# Patient Record
Sex: Female | Born: 1970 | Race: Black or African American | Hispanic: No | Marital: Single | Smoking: Current every day smoker
Health system: Southern US, Community
[De-identification: ages and names within clinical notes are randomized; demographics above are authoritative.]

## PROBLEM LIST (undated history)

## (undated) DIAGNOSIS — E785 Hyperlipidemia, unspecified: Secondary | ICD-10-CM

## (undated) DIAGNOSIS — I10 Essential (primary) hypertension: Secondary | ICD-10-CM

## (undated) DIAGNOSIS — D649 Anemia, unspecified: Secondary | ICD-10-CM

## (undated) DIAGNOSIS — M1990 Unspecified osteoarthritis, unspecified site: Secondary | ICD-10-CM

## (undated) HISTORY — DX: Unspecified osteoarthritis, unspecified site: M19.90

## (undated) HISTORY — DX: Anemia, unspecified: D64.9

## (undated) HISTORY — DX: Essential (primary) hypertension: I10

## (undated) HISTORY — DX: Hyperlipidemia, unspecified: E78.5

---

## 1998-05-01 ENCOUNTER — Encounter: Admission: RE | Admit: 1998-05-01 | Discharge: 1998-05-01 | Payer: Self-pay | Admitting: Sports Medicine

## 1998-05-01 ENCOUNTER — Other Ambulatory Visit: Admission: RE | Admit: 1998-05-01 | Discharge: 1998-05-01 | Payer: Self-pay

## 1998-12-02 ENCOUNTER — Emergency Department (HOSPITAL_COMMUNITY): Admission: EM | Admit: 1998-12-02 | Discharge: 1998-12-02 | Payer: Self-pay | Admitting: Emergency Medicine

## 2000-09-30 ENCOUNTER — Encounter: Admission: RE | Admit: 2000-09-30 | Discharge: 2000-09-30 | Payer: Self-pay | Admitting: Family Medicine

## 2001-07-24 ENCOUNTER — Emergency Department (HOSPITAL_COMMUNITY): Admission: EM | Admit: 2001-07-24 | Discharge: 2001-07-24 | Payer: Self-pay | Admitting: Emergency Medicine

## 2003-02-27 ENCOUNTER — Encounter (INDEPENDENT_AMBULATORY_CARE_PROVIDER_SITE_OTHER): Payer: Self-pay | Admitting: *Deleted

## 2003-02-27 LAB — CONVERTED CEMR LAB

## 2003-03-06 ENCOUNTER — Encounter: Admission: RE | Admit: 2003-03-06 | Discharge: 2003-03-06 | Payer: Self-pay | Admitting: Family Medicine

## 2003-03-06 ENCOUNTER — Encounter (INDEPENDENT_AMBULATORY_CARE_PROVIDER_SITE_OTHER): Payer: Self-pay | Admitting: Specialist

## 2003-03-27 ENCOUNTER — Encounter: Admission: RE | Admit: 2003-03-27 | Discharge: 2003-03-27 | Payer: Self-pay | Admitting: Family Medicine

## 2005-06-09 ENCOUNTER — Ambulatory Visit: Payer: Self-pay | Admitting: Family Medicine

## 2006-07-17 ENCOUNTER — Ambulatory Visit: Payer: Self-pay | Admitting: Family Medicine

## 2006-07-23 DIAGNOSIS — F17200 Nicotine dependence, unspecified, uncomplicated: Secondary | ICD-10-CM

## 2006-07-23 DIAGNOSIS — F411 Generalized anxiety disorder: Secondary | ICD-10-CM | POA: Insufficient documentation

## 2006-07-23 HISTORY — DX: Nicotine dependence, unspecified, uncomplicated: F17.200

## 2006-07-24 ENCOUNTER — Encounter (INDEPENDENT_AMBULATORY_CARE_PROVIDER_SITE_OTHER): Payer: Self-pay | Admitting: *Deleted

## 2007-02-24 ENCOUNTER — Emergency Department (HOSPITAL_COMMUNITY): Admission: EM | Admit: 2007-02-24 | Discharge: 2007-02-24 | Payer: Self-pay | Admitting: Emergency Medicine

## 2007-03-29 ENCOUNTER — Ambulatory Visit: Payer: Self-pay | Admitting: Family Medicine

## 2007-03-31 ENCOUNTER — Ambulatory Visit: Payer: Self-pay | Admitting: Family Medicine

## 2007-04-30 ENCOUNTER — Encounter (INDEPENDENT_AMBULATORY_CARE_PROVIDER_SITE_OTHER): Payer: Self-pay | Admitting: *Deleted

## 2009-02-13 ENCOUNTER — Ambulatory Visit: Payer: Self-pay | Admitting: Family Medicine

## 2009-02-13 ENCOUNTER — Encounter: Payer: Self-pay | Admitting: Family Medicine

## 2009-02-13 DIAGNOSIS — E669 Obesity, unspecified: Secondary | ICD-10-CM | POA: Insufficient documentation

## 2009-02-13 LAB — CONVERTED CEMR LAB: GC Probe Amp, Genital: NEGATIVE

## 2009-02-15 ENCOUNTER — Encounter: Payer: Self-pay | Admitting: Family Medicine

## 2009-07-11 ENCOUNTER — Emergency Department (HOSPITAL_COMMUNITY): Admission: EM | Admit: 2009-07-11 | Discharge: 2009-07-11 | Payer: Self-pay | Admitting: Emergency Medicine

## 2009-11-20 ENCOUNTER — Ambulatory Visit: Payer: Self-pay | Admitting: Family Medicine

## 2009-11-22 ENCOUNTER — Ambulatory Visit: Payer: Self-pay | Admitting: Family Medicine

## 2010-02-25 ENCOUNTER — Ambulatory Visit: Payer: Self-pay | Admitting: Family Medicine

## 2010-02-25 ENCOUNTER — Encounter: Payer: Self-pay | Admitting: Family Medicine

## 2010-02-25 DIAGNOSIS — S025XXA Fracture of tooth (traumatic), initial encounter for closed fracture: Secondary | ICD-10-CM | POA: Insufficient documentation

## 2010-02-25 DIAGNOSIS — N760 Acute vaginitis: Secondary | ICD-10-CM | POA: Insufficient documentation

## 2010-02-25 LAB — CONVERTED CEMR LAB: Whiff Test: POSITIVE

## 2010-02-26 LAB — CONVERTED CEMR LAB
Chlamydia, DNA Probe: NEGATIVE
GC Probe Amp, Genital: NEGATIVE

## 2010-02-27 ENCOUNTER — Encounter: Payer: Self-pay | Admitting: Family Medicine

## 2010-02-28 ENCOUNTER — Encounter: Payer: Self-pay | Admitting: Family Medicine

## 2010-02-28 ENCOUNTER — Ambulatory Visit: Payer: Self-pay | Admitting: Family Medicine

## 2010-02-28 DIAGNOSIS — D4959 Neoplasm of unspecified behavior of other genitourinary organ: Secondary | ICD-10-CM

## 2010-03-05 ENCOUNTER — Encounter: Payer: Self-pay | Admitting: Family Medicine

## 2010-03-05 ENCOUNTER — Telehealth (INDEPENDENT_AMBULATORY_CARE_PROVIDER_SITE_OTHER): Payer: Self-pay | Admitting: Family Medicine

## 2010-03-15 ENCOUNTER — Ambulatory Visit: Payer: Self-pay | Admitting: Family Medicine

## 2010-06-25 NOTE — Miscellaneous (Signed)
Summary: Consent: Colposcopy  Consent: Colposcopy   Imported By: Knox Royalty 03/11/2010 13:58:39  _____________________________________________________________________  External Attachment:    Type:   Image     Comment:   External Document
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
## 2010-06-25 NOTE — Letter (Signed)
Summary: COLPO Letter  [facility]  [address]   [place], [place] [address]   Phone: [phone]  Fax: [phone]    03/05/2010  [facility] [address] [place], [place]  [address]  Dear Ms. Grennan, The biopsy from your cervix was normal. No abnormal cells noted. This is good news and we do not need to do anythig else.          Sincerely,   Denny Levy MD  Appended Document: COLPO Letter mailed

## 2010-06-25 NOTE — Progress Notes (Signed)
  Phone Note Outgoing Call   Summary of Call: DEAR WHITE TEAM please let her know the cervix biopsy was NORMAL> I am sending her a letter but she was really nervous about this.  Thanks!  Denny Levy MD  March 05, 2010 10:43 AM   Follow-up for Phone Call        LVM for patient to call back office Follow-up by: Jimmy Footman, CMA,  March 05, 2010 11:46 AM  Additional Follow-up for Phone Call Additional follow up Details #1::        pt called back and was read the letter. she was pleased Additional Follow-up by: De Nurse,  March 05, 2010 1:34 PM

## 2010-06-25 NOTE — Letter (Signed)
Summary: Lab-Female  All     ,     Phone:   Fax:     02/27/2010        [facility] [address]  [place], [place]  [address]   Dear Ms. Sferrazza:  We have carefully reviewed the results of your tests noted below and the results are:     PAP smear: NEGATIVE FOR INTRAEPITHELIAL LESIONS OR MALIGNANCY. on 02/25/2010 -- Normal  Your STD screen for Gonorrhea and Chlamydia was negative.       THE ABOVE RESULTS ARE: WITHIN NORMAL LIMITS.    If you have any questions, please call. We appreciate being able to work with you.   Sincerely,   Milinda Antis MD Typed by: Milinda Antis MD  Appended Document: Lab-Female mailed.

## 2010-06-25 NOTE — Assessment & Plan Note (Signed)
Summary: tb test,tcb  Nurse Visit   Allergies: No Known Drug Allergies  Immunizations Administered:  PPD Skin Test:    Vaccine Type: PPD    Site: left forearm    Mfr: Sanofi Pasteur    Dose: 0.1 ml    Route: ID    Given by: Theresia Lo RN    Exp. Date: 03/08/2011    Lot #: [id number]  Orders Added: 1)  TB Skin Test [86580] 2)  Admin 1st Vaccine [phone]

## 2010-06-25 NOTE — Assessment & Plan Note (Signed)
Summary: colpo per [place]/eo   Vital Signs:  Patient profile:   40 year old female Height:      62.5 inches Weight:      208 pounds BMI:     37.57 Temp:     98.5 degrees F Pulse rate:   82 / minute BP sitting:   121 / 85  Vitals Entered By: Golden Circle RN (February 28, 2010 8:34 AM)  Primary Care Provider:  Milinda Antis MD   History of Present Illness: Pt with normal pap but lesion seen grossly on exam here for further eval  Habits & Providers  Alcohol-Tobacco-Diet     Alcohol drinks/day: 0     Tobacco Status: current     Tobacco Counseling: to quit use of tobacco products     Cigarette Packs/Day: 0.5  Exercise-Depression-Behavior     Drug Use: never     Seat Belt Use: always  Current Medications (verified): 1)  Penicillin V Potassium 500 Mg Tabs (Penicillin V Potassium) .Marland Kitchen.. 1 By Mouth Two Times A Day X 10 Days For Tooth Infection 2)  Flagyl 500 Mg Tabs (Metronidazole) .Marland Kitchen.. 1 By Mouth Two Times A Day X 7 Days, For Infection, Do Not Drink Alcohol  Allergies: No Known Drug Allergies  Social History: Drug Use:  never Seat Belt Use:  always Packs/Day:  0.5  Physical Exam  General:  overweight-appearing.   Genitalia:  normal introitus, no external lesions, no vaginal discharge, and mucosa pink and moist.  Cervix reveals small dark lesion at 11:00, flat. remainder of cervix is normal Additional Exam:  Patient given informed consent, signed copy in the chart. Placed in lithotomy position. Cervix viewed with speculum and colposcope. Was the entire squamocolumnar junction seen?no Any acetowhite lesions noted?no Any abnormalities seen with green filter?no Any abnormalities seen with application of Lugol's solution?N/A Was the endocervical canal sampled?N/A Were any cervical biopsies taken?yes 11:00 Were there any complications?no COMMENTS: Patient was given post procedure instructions. We will notify her of any results.    Impression &  Recommendations:  Problem # 1:  LESION, CERVIX (ICD-236.3)  colposcopy with biopsy today  [phone] ok to leave mssg  Orders: Colposcopy w/ biopsy [facility] ([address])  Complete Medication List: 1)  Penicillin V Potassium 500 Mg Tabs (Penicillin v potassium) .Marland Kitchen.. 1 by mouth two times a day x 10 days for tooth infection 2)  Flagyl 500 Mg Tabs (Metronidazole) .Marland Kitchen.. 1 by mouth two times a day x 7 days, for infection, do not drink alcohol  Prevention & Chronic Care Immunizations   Influenza vaccine: Not documented    Tetanus booster: 02/27/2003: Done.    Pneumococcal vaccine: Not documented  Other Screening   Pap smear: NEGATIVE FOR INTRAEPITHELIAL LESIONS OR MALIGNANCY.  (02/25/2010)   Smoking status: current  (02/28/2010)  Lipids   Total Cholesterol: Not documented   LDL: Not documented   LDL Direct: Not documented   HDL: Not documented   Triglycerides: Not documented

## 2010-06-25 NOTE — Assessment & Plan Note (Signed)
Summary: discuss diet/eo   Vital Signs:  Patient profile:   40 year old female Height:      62.5 inches Weight:      206.7 pounds BMI:     37.34 Pulse rate:   84 / minute BP sitting:   113 / 84  (right arm)  Vitals Entered By: Arlyss Repress CMA, (March 15, 2010 9:00 AM) CC: discuss diet Is Patient Diabetic? No Pain Assessment Patient in pain? no        Primary Care Provider:  Milinda Antis MD  CC:  discuss diet.  History of Present Illness: Goal is to lose 26lbs by April 2012   Feels finding a job will help her keep a schedule and not sit around watching TV eating, eating late dinners such 10pm  Does not  eat breakfast, eats lunch between 12-2pm and dinner usually late 7-10, then eats snacks later , use to eat candy during the middle of the night  24 hours Recall- Fried egg sandwhich with red sausage, Donzetta Sprung and Seffner, Cobbler and Maccooni, cabbage, baked chicken, Drink- 1 large sweet tea and Water, Water,  1 soda- [place] Dew , Ate miniture candies- chocolate, No chips/ no cookies  , no fruits or veggies,   Boyfriend is not overweight he does the cooking, he is aware that she is trying to loose weight  Boyfriend cooks a lot of rich foods and desserts and heavy foods, feels some temptation but has some control, Has canned veggies but does not cook very much herself Feels if she can stop drinking so much tea and eat less this will help  Exercise: Feels neighborhood not safe and its cold outside, thinking about [employer] , unsure if she can afford a membership  Habits & Providers  Alcohol-Tobacco-Diet     Tobacco Status: current     Tobacco Counseling: to quit use of tobacco products  Current Medications (verified): 1)  None  Allergies (verified): No Known Drug Allergies  Physical Exam  General:  overweight-appearing.  NAD Vital signs noted    Impression & Recommendations:  Problem # 1:  OBESITY, MODERATE (ICD-278.00) Assessment Unchanged  See  instructions 20 minutes spent on discussing weight loss approaches and exercise. Reiterated no quick fix and no meds exist that are safe for long term weight loss Pt to start with smaller meals- starting with portions and picked beverage as her other vice to cut down on Next visit will discuss types of foods, 3 day recall, shopping list. I do think a better daily routine as most of the day she is "vegging" out as she is young and unemployed. Naiya appears very motivated  Orders: [facility]- Est Level  3 ([address])  Problem # 2:  TOBACCO DEPENDENCE (ICD-305.1) Assessment: Unchanged  Orders: [facility]- Est Level  3 ([address])  Patient Instructions: 1)  Goal is less food/ smaller portions  and some exercise, drink more water, less tea 2)  1. Drink water with meals, Use the Small Plate  3)  2.Goal 1 sweet tea or 1 mountain dew  4)  Look into how much it cost at the [facility]  5)  Next visit-Schedule with Dr.[place]ord [place]/Dr. Gerilyn Pilgrim the nutritionist- Either Nov 10 or 17th in the evening 6)  Bring to that visit a 3 day recall of everything you ate    Orders Added: 1)  [facility]- Est Level  3 [86578]

## 2010-06-25 NOTE — Assessment & Plan Note (Signed)
Summary: READ PPD/KH  Nurse Visit   Allergies: No Known Drug Allergies  PPD Results    Date of reading: 11/22/2009    Results: 0 mm    Interpretation: negative  Orders Added: 1)  No Charge Patient Arrived (NCPA0) [NCPA0] 

## 2011-03-06 LAB — RAPID STREP SCREEN (MED CTR MEBANE ONLY): Streptococcus, Group A Screen (Direct): POSITIVE — AB

## 2011-03-27 ENCOUNTER — Ambulatory Visit: Payer: Self-pay

## 2011-03-28 ENCOUNTER — Ambulatory Visit (INDEPENDENT_AMBULATORY_CARE_PROVIDER_SITE_OTHER): Payer: BC Managed Care – PPO | Admitting: Family Medicine

## 2011-03-28 ENCOUNTER — Encounter: Payer: Self-pay | Admitting: Family Medicine

## 2011-03-28 VITALS — BP 120/83 | HR 76 | Temp 97.9°F | Ht 62.5 in | Wt 186.3 lb

## 2011-03-28 DIAGNOSIS — A499 Bacterial infection, unspecified: Secondary | ICD-10-CM

## 2011-03-28 DIAGNOSIS — N760 Acute vaginitis: Secondary | ICD-10-CM

## 2011-03-28 DIAGNOSIS — B9689 Other specified bacterial agents as the cause of diseases classified elsewhere: Secondary | ICD-10-CM

## 2011-03-28 LAB — POCT WET PREP (WET MOUNT): Trichomonas Wet Prep HPF POC: NEGATIVE

## 2011-03-28 MED ORDER — METRONIDAZOLE 500 MG PO TABS: 500.0000 mg | ORAL_TABLET | Freq: Two times a day (BID) | ORAL | Status: AC

## 2011-03-29 DIAGNOSIS — B9689 Other specified bacterial agents as the cause of diseases classified elsewhere: Secondary | ICD-10-CM | POA: Insufficient documentation

## 2011-03-29 NOTE — Patient Instructions (Signed)
Bacterial Vaginosis Bacterial vaginosis (BV) is a vaginal infection where the normal balance of bacteria in the vagina is disrupted. The normal balance is then replaced by an overgrowth of certain bacteria. There are several different kinds of bacteria that can cause BV. BV is the most common vaginal infection in women of childbearing age. CAUSES   The cause of BV is not fully understood. BV develops when there is an increase or imbalance of harmful bacteria.   Some activities or behaviors can upset the normal balance of bacteria in the vagina and put women at increased risk including:   Having a new sex partner or multiple sex partners.   Douching.   Using an intrauterine device (IUD) for contraception.   It is not clear what role sexual activity plays in the development of BV. However, women that have never had sexual intercourse are rarely infected with BV.  Women do not get BV from toilet seats, bedding, swimming pools or from touching objects around them.  SYMPTOMS   Grey vaginal discharge.   A fish-like odor with discharge, especially after sexual intercourse.   Itching or burning of the vagina and vulva.   Burning or pain with urination.   Some women have no signs or symptoms at all.  DIAGNOSIS  Your caregiver must examine the vagina for signs of BV. Your caregiver will perform lab tests and look at the sample of vaginal fluid through a microscope. They will look for bacteria and abnormal cells (clue cells), a pH test higher than 4.5, and a positive amine test all associated with BV.  RISKS AND COMPLICATIONS   Pelvic inflammatory disease (PID).   Infections following gynecology surgery.   Developing HIV.   Developing herpes virus.  TREATMENT  Sometimes BV will clear up without treatment. However, all women with symptoms of BV should be treated to avoid complications, especially if gynecology surgery is planned. Female partners generally do not need to be treated. However,  BV may spread between female sex partners so treatment is helpful in preventing a recurrence of BV.   BV may be treated with antibiotics. The antibiotics come in either pill or vaginal cream forms. Either can be used with nonpregnant or pregnant women, but the recommended dosages differ. These antibiotics are not harmful to the baby.   BV can recur after treatment. If this happens, a second round of antibiotics will often be prescribed.   Treatment is important for pregnant women. If not treated, BV can cause a premature delivery, especially for a pregnant woman who had a premature birth in the past. All pregnant women who have symptoms of BV should be checked and treated.   For chronic reoccurrence of BV, treatment with a type of prescribed gel vaginally twice a week is helpful.  HOME CARE INSTRUCTIONS   Finish all medication as directed by your caregiver.   Do not have sex until treatment is completed.   Tell your sexual partner that you have a vaginal infection. They should see their caregiver and be treated if they have problems, such as a mild rash or itching.   Practice safe sex. Use condoms. Only have 1 sex partner.  PREVENTION  Basic prevention steps can help reduce the risk of upsetting the natural balance of bacteria in the vagina and developing BV:  Do not have sexual intercourse (be abstinent).   Do not douche.   Use all of the medicine prescribed for treatment of BV, even if the signs and symptoms go away.     Tell your sex partner if you have BV. That way, they can be treated, if needed, to prevent reoccurrence.  SEEK MEDICAL CARE IF:   Your symptoms are not improving after 3 days of treatment.   You have increased discharge, pain, or fever.  MAKE SURE YOU:   Understand these instructions.   Will watch your condition.   Will get help right away if you are not doing well or get worse.  FOR MORE INFORMATION  Division of STD Prevention (DSTDP), Centers for Disease  Control and Prevention: www.cdc.gov/std American Social Health Association (ASHA): www.ashastd.org  Document Released: 05/12/2005 Document Revised: 01/22/2011 Document Reviewed: 11/02/2008 ExitCare Patient Information 2012 [employer]. 

## 2011-03-29 NOTE — Progress Notes (Signed)
  Subjective:    Patient ID: Angelica Campbell, female    DOB: January 08, 1971, 40 y.o.   MRN: [id number]  Vaginal Discharge The patient's primary symptoms include a vaginal discharge.   Vaginal discharge x 1 month. Pt states that discharge has been foul smelling. Pt states that she has hx/o recurrent BV infections. Most recent episode was 6-8 months ago per pt. No fevers, abd pain, vomiting. Discharge hs been foul smelling and is consistent with prior episodes of BV. Pt states that she is not sexually active. Hx/o STDs almost 20 years ago per pt.    Review of Systems  Genitourinary: Positive for vaginal discharge.   See HPI     Objective:   Physical Exam Gen: in bed, NAD ABD: S/NT/+ bowel sounds  GU: normal external genitalia, + vaginal discharge, no CMT   Assessment & Plan:

## 2011-03-29 NOTE — Assessment & Plan Note (Signed)
Clue cells on wet prep. GC/Chl also obtained. Flagyl x 7 days.

## 2011-03-30 ENCOUNTER — Encounter: Payer: Self-pay | Admitting: Family Medicine

## 2011-04-04 ENCOUNTER — Encounter: Payer: Self-pay | Admitting: Family Medicine
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
## 2012-12-09 ENCOUNTER — Ambulatory Visit (INDEPENDENT_AMBULATORY_CARE_PROVIDER_SITE_OTHER): Payer: BC Managed Care – PPO | Admitting: Family Medicine

## 2012-12-09 ENCOUNTER — Encounter: Payer: Self-pay | Admitting: Family Medicine

## 2012-12-09 ENCOUNTER — Other Ambulatory Visit (HOSPITAL_COMMUNITY)
Admission: RE | Admit: 2012-12-09 | Discharge: 2012-12-09 | Disposition: A | Discharge status: Home / Self Care | Payer: BC Managed Care – PPO | Source: Ambulatory Visit | Attending: Family Medicine | Admitting: Family Medicine

## 2012-12-09 VITALS — BP 90/52 | HR 89 | Ht 62.0 in | Wt 179.0 lb

## 2012-12-09 DIAGNOSIS — Z113 Encounter for screening for infections with a predominantly sexual mode of transmission: Secondary | ICD-10-CM | POA: Insufficient documentation

## 2012-12-09 DIAGNOSIS — N760 Acute vaginitis: Secondary | ICD-10-CM

## 2012-12-09 DIAGNOSIS — N898 Other specified noninflammatory disorders of vagina: Secondary | ICD-10-CM

## 2012-12-09 DIAGNOSIS — Z01419 Encounter for gynecological examination (general) (routine) without abnormal findings: Secondary | ICD-10-CM | POA: Insufficient documentation

## 2012-12-09 DIAGNOSIS — Z124 Encounter for screening for malignant neoplasm of cervix: Secondary | ICD-10-CM

## 2012-12-09 LAB — POCT WET PREP (WET MOUNT)
Clue Cells Wet Prep Whiff POC: POSITIVE
WBC, Wet Prep HPF POC: >20

## 2012-12-09 MED ORDER — METRONIDAZOLE 500 MG PO TABS: 500.0000 mg | ORAL_TABLET | Freq: Two times a day (BID) | ORAL | Status: DC

## 2012-12-09 NOTE — Patient Instructions (Addendum)
Thanks for coming in today. It was good to meet you!  Today we discussed your vaginal discharge and performed a pap smear.  From our exam, it looks like you do have Bacterial Vaginosis (BV). As we discussed, this could be caused by a number of different things, including the douching.  We started a new medication today to treat this BV. Flagyl 500 mg twice a day for 7 days total. Read the package information and ask your pharmacist if you have any questions. Caution do NOT drink alcohol while taking this medication, it will cause you to get very sick and vomit.  We will contact you with the results of your other testing and pap smear results if there is anything abnormal.  Please schedule a follow-up appointment for a regular physical in 6 months to 1 year, or sooner as needed.  If you have any other questions or concerns, please feel free to call the clinic to contact me. You may also schedule another appointment if necessary.  However, if your symptoms get significantly worse, please go to the Emergency Department to seek immediate medical attention.  Saralyn Pilar, DO [facility]   Bacterial Vaginosis Bacterial vaginosis (BV) is a vaginal infection where the normal balance of bacteria in the vagina is disrupted. The normal balance is then replaced by an overgrowth of certain bacteria. There are several different kinds of bacteria that can cause BV. BV is the most common vaginal infection in women of childbearing age. CAUSES   The cause of BV is not fully understood. BV develops when there is an increase or imbalance of harmful bacteria.  Some activities or behaviors can upset the normal balance of bacteria in the vagina and put women at increased risk including:  Having a new sex partner or multiple sex partners.  Douching.  Using an intrauterine device (IUD) for contraception.  It is not clear what role sexual activity plays in the development of BV.  However, women that have never had sexual intercourse are rarely infected with BV. Women do not get BV from toilet seats, bedding, swimming pools or from touching objects around them.  SYMPTOMS   Grey vaginal discharge.  A fish-like odor with discharge, especially after sexual intercourse.  Itching or burning of the vagina and vulva.  Burning or pain with urination.  Some women have no signs or symptoms at all. DIAGNOSIS  Your caregiver must examine the vagina for signs of BV. Your caregiver will perform lab tests and look at the sample of vaginal fluid through a microscope. They will look for bacteria and abnormal cells (clue cells), a pH test higher than 4.5, and a positive amine test all associated with BV.  RISKS AND COMPLICATIONS   Pelvic inflammatory disease (PID).  Infections following gynecology surgery.  Developing HIV.  Developing herpes virus. TREATMENT  Sometimes BV will clear up without treatment. However, all women with symptoms of BV should be treated to avoid complications, especially if gynecology surgery is planned. Female partners generally do not need to be treated. However, BV may spread between female sex partners so treatment is helpful in preventing a recurrence of BV.   BV may be treated with antibiotics. The antibiotics come in either pill or vaginal cream forms. Either can be used with nonpregnant or pregnant women, but the recommended dosages differ. These antibiotics are not harmful to the baby.  BV can recur after treatment. If this happens, a second round of antibiotics will often be prescribed.  Treatment is important for pregnant women. If not treated, BV can cause a premature delivery, especially for a pregnant woman who had a premature birth in the past. All pregnant women who have symptoms of BV should be checked and treated.  For chronic reoccurrence of BV, treatment with a type of prescribed gel vaginally twice a week is helpful. HOME CARE  INSTRUCTIONS   Finish all medication as directed by your caregiver.  Do not have sex until treatment is completed.  Tell your sexual partner that you have a vaginal infection. They should see their caregiver and be treated if they have problems, such as a mild rash or itching.  Practice safe sex. Use condoms. Only have 1 sex partner. PREVENTION  Basic prevention steps can help reduce the risk of upsetting the natural balance of bacteria in the vagina and developing BV:  Do not have sexual intercourse (be abstinent).  Do not douche.  Use all of the medicine prescribed for treatment of BV, even if the signs and symptoms go away.  Tell your sex partner if you have BV. That way, they can be treated, if needed, to prevent reoccurrence. SEEK MEDICAL CARE IF:   Your symptoms are not improving after 3 days of treatment.  You have increased discharge, pain, or fever. MAKE SURE YOU:   Understand these instructions.  Will watch your condition.  Will get help right away if you are not doing well or get worse. FOR MORE INFORMATION  Division of STD Prevention (DSTDP), Centers for Disease Control and Prevention: [employer] American Social Health Association (ASHA): www.ashastd.org  Document Released: 05/12/2005 Document Revised: 08/04/2011 Document Reviewed: 11/02/2008 [facility] Patient Information 2014 [place], [place].

## 2012-12-09 NOTE — Assessment & Plan Note (Addendum)
Similar complaint to previous episode of BV. Increased white discharge, with occasional odor and some itching. No vaginal bleeding, abd pain, or constitutional sx. Reports sexual hx of 1 partner for 15 years, no condom use, remote hx Chlamydia (>58yrs). Admits to douching frequently, was told before it could cause bacterial growth.  Performed Pelvic Exam for pap smear, wet prep, and GC/Chlamydia testing. Mild thin white discharge, with minimal odor on exam, no signs of inflammation, no lesions, and no cervical motor tenderness. Normal bi-manual exam.  Pap smear performed. GC/Chlam swab collected. Pending results in about 1 week. Wet Prep - +KOH Whiff test, +many Clue cells, >20 WBCs, negative for yeast, trich  For BV, will treat with Flagyl 500mg  BID for 7 days. Advised on side-effects, cautioned NO EtOH. Provided education on preventing BV, patient agrees, will likely quit douching to clean. Will call with results of pap and GC/Chlam. Follow-up PRN.

## 2012-12-09 NOTE — Progress Notes (Signed)
Subjective:     Patient ID: Angelica Campbell, female   DOB: 10-22-1970, 42 y.o.   MRN: [id number]  HPI  VAGINAL DISCHARGE:  42 y.o. female complains of white and thin vaginal discharge with mild odor for 1 week. Admits to some vaginal itching. Reports that this is similar to prior episode of BV about 2 years ago. She does douche for cleansing, and was told this might cause bacteria to grow, but has continued to do so.  Denies abnormal vaginal bleeding, pelvic or abdominal pain, fever. No UTI symptoms or burning on urination. Denies recent history of known exposure to STD.  Sexual Hx: 1 sexual partner in past 15 years, no condom use, remote hx of Chlamydia (>55yrs), but denies any increased risk of STI infection.   Review of Systems  See above HPI.     Objective:   Physical Exam  BP 90/52  Pulse 89  Ht 5\' 2"  (1.575 m)  Wt 179 lb (81.194 kg)  BMI 32.73 kg/m2  General - pleasant, WDWN, NAD Neck - supple, non-tender, no LAD Abd - soft, non-tender, no masses, +BS Pelvic exam: normal external genitalia, vulva, vagina, cervix, uterus and adnexa, VULVA: normal appearing vulva with no masses, tenderness or lesions, VAGINA: normal appearing vagina with normal color and discharge, no lesions, vaginal discharge - white, thin, CERVIX: cervical discharge present - white, UTERUS: uterus is normal size, shape, consistency and nontender, ADNEXA: normal adnexa in size, nontender and no masses  PAP: Pap smear done today. exam chaperoned by Arlyss Repress GC/Chlamydia test collected. WET MOUNT done - results: many clue cells, >20 WBC, +KOH whiff test, no trichomonas, no hyphae or spores seen

## 2012-12-14 ENCOUNTER — Telehealth: Payer: Self-pay | Admitting: *Deleted

## 2012-12-14 NOTE — Telephone Encounter (Signed)
Called pt. LMVM to call back. Please see Dr.Karamalegos message. Thank you. Lorenda Hatchet, Renato Battles

## 2012-12-14 NOTE — Telephone Encounter (Signed)
Message copied by Arlyss Repress on Tue Dec 14, 2012 10:47 AM ------      Message from: Smitty Cords      Created: Tue Dec 14, 2012 10:34 AM      Regarding: Normal Results       Hi,      I have reviewed the recent results for Cytology PAP smear and GC/Chlamydia collected (12/09/12). All results were normal.            This does not affect the results of her Wet Prep on 12/09/12 that showed BV. She is to continue her Flagyl medication for a total of 7 days, as we discussed at her apt. She does not need another apt at this time, unless she is not improving.            She can call to schedule a follow-up apt in 6 months to 1 yr.            If you could please call her to relay this message, I would greatly appreciate it!            Thanks!      -Trinna Post K ------

## 2013-07-22 ENCOUNTER — Encounter: Payer: Self-pay | Admitting: Family Medicine

## 2013-07-22 ENCOUNTER — Ambulatory Visit (INDEPENDENT_AMBULATORY_CARE_PROVIDER_SITE_OTHER): Payer: BC Managed Care – PPO | Admitting: Family Medicine

## 2013-07-22 VITALS — BP 127/88 | HR 90 | Temp 99.5°F | Ht 62.5 in | Wt 187.0 lb

## 2013-07-22 DIAGNOSIS — H9203 Otalgia, bilateral: Secondary | ICD-10-CM

## 2013-07-22 DIAGNOSIS — J029 Acute pharyngitis, unspecified: Secondary | ICD-10-CM

## 2013-07-22 DIAGNOSIS — H9209 Otalgia, unspecified ear: Secondary | ICD-10-CM

## 2013-07-22 LAB — POCT RAPID STREP A (OFFICE): RAPID STREP A SCREEN: NEGATIVE

## 2013-07-22 MED ORDER — IPRATROPIUM BROMIDE 0.06 % NA SOLN: 2.0000 | Freq: Four times a day (QID) | NASAL | Status: DC

## 2013-07-22 MED ORDER — BENZOCAINE-MENTHOL 15-4 MG MT LOZG: 1.0000 | LOZENGE | OROMUCOSAL | Status: DC | PRN

## 2013-07-22 MED ORDER — CETIRIZINE HCL 10 MG PO TABS: 10.0000 mg | ORAL_TABLET | Freq: Every day | ORAL | Status: DC

## 2013-07-22 MED ORDER — PREDNISONE 20 MG PO TABS: 60.0000 mg | ORAL_TABLET | Freq: Every day | ORAL | Status: DC

## 2013-07-22 MED ORDER — IBUPROFEN 600 MG PO TABS: 600.0000 mg | ORAL_TABLET | Freq: Four times a day (QID) | ORAL | Status: DC | PRN

## 2013-07-22 NOTE — Assessment & Plan Note (Signed)
No evidence of otitis media on exam. Likely combination of sinus congestion and viral URI contributing to ear discomfort. Ipratropium spray and zyrtec for nasal congestion

## 2013-07-22 NOTE — Progress Notes (Signed)
Patient ID: ORENA CAVAZOS    DOB: 29-Sep-1970, 43 y.o.   MRN: [id number] --- Subjective:  Arabela is a 43 y.o.female who presents with sore throat and bilateral ear pain since Wednesday. She started noticing tingling in her ears and now she has a sensation of discomfort bilaterally. She also says that her throat has been sore to the point of not being able to eat. She has not really had any desire to drink fluids either. She denies any fevers. She denies any cough. Denies any congestion or rhinorrhea. No shortness of breath. She has an occasional associated headache. She's been trying NyQuil which has not helped. She has tried ibuprofen which has not helped either.  ROS: see HPI Past Medical History: reviewed and updated medications and allergies. Social History: Tobacco: Current smoker  Objective: Filed Vitals:   07/22/13 1021  BP: 127/88  Pulse: 90  Temp: 99.5 F (37.5 C)    Physical Examination:   General appearance - alert, well appearing, and in no distress Ears - bilateral TM's and external ear canals normal, no effusion, no erythema or bulging membranes, no external pain.  Nose - moderate erythema and congestion of the nasal turbinates bilaterally Mouth - moist mucous membranes, erythematous oropharynx and tonsils, no tonsillar exudates Neck - supple, no tender lymphadenopathy Chest - clear to auscultation, no wheezes, rales or rhonchi, symmetric air entry Heart - normal rate, regular rhythm, normal S1, S2, no murmurs

## 2013-07-22 NOTE — Patient Instructions (Addendum)
The strep test was negative.  For the sore throat, I am going to treat you with 2 days of steroids to help with it.  I am also sending a prescription for high dose ibuprofen to take every 6hrs as needed for pain. Do not take the aleve at the same time.   I am also going to send a nose spray and an allergy pill which I think will also help with the headache and feeling of pressure in your ears.   If you do not get any better over the weekend, if you gets worst or develop fever, please return to care.   Pharyngitis Pharyngitis is redness, pain, and swelling (inflammation) of your pharynx.  CAUSES  Pharyngitis is usually caused by infection. Most of the time, these infections are from viruses (viral) and are part of a cold. However, sometimes pharyngitis is caused by bacteria (bacterial). Pharyngitis can also be caused by allergies. Viral pharyngitis may be spread from person to person by coughing, sneezing, and personal items or utensils (cups, forks, spoons, toothbrushes). Bacterial pharyngitis may be spread from person to person by more intimate contact, such as kissing.  SIGNS AND SYMPTOMS  Symptoms of pharyngitis include:   Sore throat.   Tiredness (fatigue).   Low-grade fever.   Headache.  Joint pain and muscle aches.  Skin rashes.  Swollen lymph nodes.  Plaque-like film on throat or tonsils (often seen with bacterial pharyngitis). DIAGNOSIS  Your health care provider will ask you questions about your illness and your symptoms. Your medical history, along with a physical exam, is often all that is needed to diagnose pharyngitis. Sometimes, a rapid strep test is done. Other lab tests may also be done, depending on the suspected cause.  TREATMENT  Viral pharyngitis will usually get better in 3 4 days without the use of medicine. Bacterial pharyngitis is treated with medicines that kill germs (antibiotics).  HOME CARE INSTRUCTIONS   Drink enough water and fluids to keep your  urine clear or pale yellow.   Only take over-the-counter or prescription medicines as directed by your health care provider:   If you are prescribed antibiotics, make sure you finish them even if you start to feel better.   Do not take aspirin.   Get lots of rest.   Gargle with 8 oz of salt water ( tsp of salt per 1 qt of water) as often as every 1 2 hours to soothe your throat.   Throat lozenges (if you are not at risk for choking) or sprays may be used to soothe your throat. SEEK MEDICAL CARE IF:   You have large, tender lumps in your neck.  You have a rash.  You cough up green, yellow-brown, or bloody spit. SEEK IMMEDIATE MEDICAL CARE IF:   Your neck becomes stiff.  You drool or are unable to swallow liquids.  You vomit or are unable to keep medicines or liquids down.  You have severe pain that does not go away with the use of recommended medicines.  You have trouble breathing (not caused by a stuffy nose). MAKE SURE YOU:   Understand these instructions.  Will watch your condition.  Will get help right away if you are not doing well or get worse. Document Released: 05/12/2005 Document Revised: 03/02/2013 Document Reviewed: 01/17/2013 [facility] Patient Information 2014 [place].

## 2013-07-22 NOTE — Assessment & Plan Note (Signed)
Strep test negative and centor criteria score of 1. No indication to check culture.  Likely viral.  Since she is having difficulty swallowing, will treat with 2 days of prednisone 60mg  daily.  Treat pain with ibuprofen 600mg  q6/prn.  cepacol lozenges

## 2014-01-18 ENCOUNTER — Encounter: Payer: Self-pay | Admitting: Family Medicine

## 2014-01-18 ENCOUNTER — Ambulatory Visit (INDEPENDENT_AMBULATORY_CARE_PROVIDER_SITE_OTHER): Payer: Self-pay | Admitting: Family Medicine

## 2014-01-18 VITALS — BP 116/83 | HR 101 | Temp 98.2°F | Ht 62.5 in | Wt 182.0 lb

## 2014-01-18 DIAGNOSIS — T7840XA Allergy, unspecified, initial encounter: Secondary | ICD-10-CM

## 2014-01-18 NOTE — Patient Instructions (Signed)

## 2014-01-18 NOTE — Assessment & Plan Note (Signed)
Non-severe without any throat swelling or oral edema.  She is not on medications so not a drug reaction.  Most likely bug bite or food (no seafood allergy history or recent seafood ingestion).  At this point, continue with zyrtec daily and benadryl 25 mg TID PRN for itching and topical hydrocortisone to arm if itchy.  F/U PRN.

## 2014-01-18 NOTE — Progress Notes (Signed)
Angelica Campbell is a 43 y.o. female who presents today for facial edema and hand swelling.  This began about three days ago now, after a church functioning meal.  She was not sure what she ate at that time but thinks it was related to food or possible bug bite on her R hand.  She started to have hand edema two days ago and this ended up spreading proximally to her arm but denies any pain, paresthesias, fever, chills, or sweats.  She started to have eyelid swelling yesterday but denies any throat edema, dysphagia, dyspnea, or diplopia.  She has tried zyrtec and started children's benadryl yesterday that has helped somewhat.   No past medical history on file.  History  Smoking status  . Current Every Day Smoker -- 0.50 packs/day  . Types: Cigarettes  Smokeless tobacco  . Not on file    No family history on file.  Current Outpatient Prescriptions on File Prior to Visit  Medication Sig Dispense Refill  . Benzocaine-Menthol 15-4 MG LOZG Use as directed 1 lozenge in the mouth or throat as needed (throat pain).  168 lozenge  0  . cetirizine (ZYRTEC) 10 MG tablet Take 1 tablet (10 mg total) by mouth daily.  30 tablet  11  . ibuprofen (ADVIL,MOTRIN) 600 MG tablet Take 1 tablet (600 mg total) by mouth every 6 (six) hours as needed.  30 tablet  0  . ipratropium (ATROVENT) 0.06 % nasal spray Place 2 sprays into both nostrils 4 (four) times daily.  15 mL  12  . metroNIDAZOLE (FLAGYL) 500 MG tablet Take 1 tablet (500 mg total) by mouth 2 (two) times daily.  21 tablet  0  . predniSONE (DELTASONE) 20 MG tablet Take 3 tablets (60 mg total) by mouth daily with breakfast. Take for 2 days  6 tablet  0   No current facility-administered medications on file prior to visit.    ROS: Per HPI.  All other systems reviewed and are negative.   Physical Exam Filed Vitals:   01/18/14 1346  BP: 116/83  Pulse: 101  Temp: 98.2 F (36.8 C)    Physical Examination: General appearance - alert, well appearing,  and in no distress Mouth - O/P clear, no glossitis  Neck - supple, no significant adenopathy Skin - R hand with minimal edema, no erythema or obvious bite/deformity.  No warmth to touch or TTP

## 2014-01-20 ENCOUNTER — Telehealth: Payer: Self-pay | Admitting: Family Medicine

## 2014-01-20 NOTE — Telephone Encounter (Signed)
Letter printed and placed upfront. Pt aware per jackie. [place], [place] CMA

## 2014-01-20 NOTE — Telephone Encounter (Signed)
Pt called and needs a letter stating that she was at the doctors on 8/26 for a sick visit.  Please call her when it is ready for pick up. jw

## 2014-08-09 ENCOUNTER — Encounter: Payer: Self-pay | Admitting: Family Medicine

## 2014-08-09 ENCOUNTER — Ambulatory Visit (INDEPENDENT_AMBULATORY_CARE_PROVIDER_SITE_OTHER): Payer: PRIVATE HEALTH INSURANCE | Admitting: Family Medicine

## 2014-08-09 VITALS — BP 118/62 | HR 106 | Temp 98.0°F | Wt 184.4 lb

## 2014-08-09 DIAGNOSIS — B9689 Other specified bacterial agents as the cause of diseases classified elsewhere: Secondary | ICD-10-CM

## 2014-08-09 DIAGNOSIS — Z131 Encounter for screening for diabetes mellitus: Secondary | ICD-10-CM

## 2014-08-09 DIAGNOSIS — E669 Obesity, unspecified: Secondary | ICD-10-CM

## 2014-08-09 DIAGNOSIS — Z13220 Encounter for screening for lipoid disorders: Secondary | ICD-10-CM

## 2014-08-09 DIAGNOSIS — A499 Bacterial infection, unspecified: Secondary | ICD-10-CM

## 2014-08-09 DIAGNOSIS — N898 Other specified noninflammatory disorders of vagina: Secondary | ICD-10-CM | POA: Insufficient documentation

## 2014-08-09 DIAGNOSIS — F17200 Nicotine dependence, unspecified, uncomplicated: Secondary | ICD-10-CM

## 2014-08-09 DIAGNOSIS — N760 Acute vaginitis: Secondary | ICD-10-CM

## 2014-08-09 DIAGNOSIS — Z0000 Encounter for general adult medical examination without abnormal findings: Secondary | ICD-10-CM

## 2014-08-09 DIAGNOSIS — Z720 Tobacco use: Secondary | ICD-10-CM

## 2014-08-09 LAB — POCT WET PREP (WET MOUNT): CLUE CELLS WET PREP WHIFF POC: POSITIVE

## 2014-08-09 MED ORDER — METRONIDAZOLE 500 MG PO TABS: 500.0000 mg | ORAL_TABLET | Freq: Two times a day (BID) | ORAL | Status: DC

## 2014-08-09 NOTE — Assessment & Plan Note (Addendum)
Confirmed BV on wet prep (mod clue cells, positive whiff) - Normal history of BV with 1 episode every 2 years. Does not seem to have recurrent or resistant episodes.  Plan: 1. Metronidazole 500mg  PO BID x 7 days - avoid alcohol 2. Advised on counseling to prevent BV - condoms, avoid basic pH semen, try yogurt / probiotics, avoid douching / cleansing 3. RTC PRN

## 2014-08-09 NOTE — Assessment & Plan Note (Signed)
Chronic active smoker, 0.5ppd >20 years - Currently not ready to quit - Previously quit x 1 year when pregnant - No medications or NRT  Plan: 1. Smoking cessation counseling. Advised to mentally prepare and plan for future quit date, taper down on # of cigs daily, seek support 2. Follow-up when ready, can refer to Dr. Valentina Lucks (Pharmacy Clinic - Smoking Cessation) 3. Considering 1 year quit date at next physical

## 2014-08-09 NOTE — Assessment & Plan Note (Signed)
Screening: - Ordered future BMET (family hx DM) and fasting Lipid Panel (will calculate ASCVD risk calculator, +smoker) - Declined routine HIV screen - Handout given and interested in scheduling Mammogram - Due for pap smear and High Risk HPV (co-testing) in 11/2015  Immunizations: - Declined influenza, TDap

## 2014-08-09 NOTE — Progress Notes (Signed)
   Subjective:    Patient ID: Angelica Campbell, female    DOB: [birth year], 44 y.o.   MRN: [id number]  Patient presents for annual exam.  HPI  VAGINAL DISCHARGE: - History of prior BV episodes in past, seems to be about 1 every 2 years - Today reports recent increased vaginal odor and intermittent "creamy" vaginal discharge, similar to prior BV. No recent known triggers. Previously did some douching to cleanse, but has not done this recently. - Sexual history, 1 female partner, same for >10 years, unprotected (no condom use) - No prior history of STD - Denies any fevers/chills, itching, dysuria, rash, abdominal or pelvic pain  TOBACCO ABUSE: - Active smoker, 0.5ppd, chronic smoker >20 years. Previously quit while pregnant - Never tried medications or NRT - Has not tried to quit recently. Feels like she "wants to stop", but is not prepared.  Family hx: - Breast Cancer - Mother (20 yr)  HM: - Last pap smear 11/2012 (negative, no HPV testing), due 11/2015 with HPV co-testing. 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 Normal work of breathing. Abd - soft, NTND, no masses, +active BS Ext - non-tender, no edema, peripheral pulses intact +2 b/l Skin - warm, dry, no rashes Neuro - awake, alert, grossly non-focal Pelvic Exam: Normal external female genitalia. Vaginal canal without lesions. Normal appearing cervix, without lesions or bleeding. Increased thin creamy white discharge on exam. Bimanual exam without masses or cervical motion tenderness.  Exam chaperoned by Delray Alt, CMA.     Assessment & Plan:   See specific A&P problem list for details.

## 2014-08-09 NOTE — Patient Instructions (Addendum)
Thank you for coming into clinic today.  Performed wet prep today - showed BV - take Metronidazole 500mg  twice daily for 7 days. If develop yeast infection after, may call for treatment Recommendations to reduce Bacterial Vaginosis - partner wear condoms or avoid direct contact with semen to disrupt vaginal pH, may eat yogurt or probiotic to restore healthy bacteria, avoid cleansing chemical or procedures This is normal, and may occur 1-2x yearly or every 2 years. Due for Pap Smear in 11/2015 Consider flu shot in future  When you are ready to quit smoking please let me know, and I can help you with nicotine replacement or medications. Can refer you to Dr. Valentina Lucks - smoking cessation specialist pharmacist.  Ordered Future blood work with Cholesterol and [facility] - please call [place] to Schedule a "Lab Only Visit" for blood work - do not eat after midnight on night before you choose to come in for fasting labs, don't eat or drink in the morning. Schedule early appointment for labs only.  Please schedule follow-up in 1 year or as needed for next physical.  [name], [place], PGY-2

## 2015-05-24 ENCOUNTER — Encounter: Payer: Self-pay | Admitting: Family Medicine

## 2015-05-24 ENCOUNTER — Ambulatory Visit (INDEPENDENT_AMBULATORY_CARE_PROVIDER_SITE_OTHER): Payer: Managed Care, Other (non HMO) | Admitting: Family Medicine

## 2015-05-24 VITALS — BP 133/80 | HR 86 | Temp 98.0°F | Wt 194.0 lb

## 2015-05-24 DIAGNOSIS — R300 Dysuria: Secondary | ICD-10-CM

## 2015-05-24 DIAGNOSIS — N218 Other lower urinary tract calculus: Secondary | ICD-10-CM | POA: Diagnosis not present

## 2015-05-24 DIAGNOSIS — N209 Urinary calculus, unspecified: Secondary | ICD-10-CM | POA: Insufficient documentation

## 2015-05-24 LAB — POCT URINALYSIS DIPSTICK
Bilirubin, UA: NEGATIVE
Blood, UA: NEGATIVE
Glucose, UA: NEGATIVE
Ketones, UA: NEGATIVE
Leukocytes, UA: NEGATIVE
Nitrite, UA: NEGATIVE
Protein, UA: NEGATIVE
Spec Grav, UA: 1.025
Urobilinogen, UA: 0.2
pH, UA: 6.5

## 2015-05-24 NOTE — Assessment & Plan Note (Addendum)
Dx by history, recently passed. Doubt musculoskeletal or gastrointestinal etiology. Reviewed prophylactic measures for kidney stones, see AVS, and recommended NSAID and return to clinic prn recurrent symptoms. Urged not to take prescription pain medication not prescribed to her.

## 2015-05-24 NOTE — Progress Notes (Signed)
Subjective: Angelica Campbell is a 44 y.o. female presenting for right flank pain, now improved.  4 days ago noted abrupt onset of right lower back/side pain that was severe, sharp, constant, and radiating into the groin. She was unable to sit still. Worsened by laying on that side, better the next morning after taking tylenol and her boyfriend's pain medication. The pain is now better and more dull. Some dysuria but no fevers or gross blood in urine. Reports nausea without vomiting or abdominal pain.   - No personal history of kidney stones or gout. Uncle had kidney stones - Smokes about 1/2 ppd, no EtOH, no illicit drugs.   Objective: BP 133/80 mmHg  Pulse 86  Temp(Src) 98 F (36.7 C) (Oral)  Wt 194 lb (87.998 kg) Gen: Well-appearing 44 y.o. female in no distress GI: Normoactive BS; soft, non-tender, non-distended, no organomegaly or suprapubic tenderness. Back: No CVA tenderness or tenderness to palpation of spine and paraspinal muscles.   Assessment/Plan: Angelica Campbell is a 44 y.o. female here for probable passed kidney stone.  Urolithiasis Dx by history, recently passed. Doubt musculoskeletal or gastrointestinal etiology. Reviewed prophylactic measures for kidney stones, see AVS, and recommended NSAID and return to clinic prn recurrent symptoms. Urged not to take prescription pain medication not prescribed to her.

## 2015-05-24 NOTE — Patient Instructions (Signed)
Kidney Stones °Kidney stones (urolithiasis) are deposits that form inside your kidneys. The intense pain is caused by the stone moving through the urinary tract. When the stone moves, the ureter goes into spasm around the stone. The stone is usually passed in the urine.  °CAUSES  °· A disorder that makes certain neck glands produce too much parathyroid hormone (primary hyperparathyroidism). °· A buildup of uric acid crystals, similar to gout in your joints. °· Narrowing (stricture) of the ureter. °· A kidney obstruction present at birth (congenital obstruction). °· Previous surgery on the kidney or ureters. °· Numerous kidney infections. °SYMPTOMS  °· Feeling sick to your stomach (nauseous). °· Throwing up (vomiting). °· Blood in the urine (hematuria). °· Pain that usually spreads (radiates) to the groin. °· Frequency or urgency of urination. °DIAGNOSIS  °· Taking a history and physical exam. °· Blood or urine tests. °· CT scan. °· Occasionally, an examination of the inside of the urinary bladder (cystoscopy) is performed. °TREATMENT  °· Observation. °· Increasing your fluid intake. °· Extracorporeal shock wave lithotripsy--This is a noninvasive procedure that uses shock waves to break up kidney stones. °· Surgery may be needed if you have severe pain or persistent obstruction. There are various surgical procedures. Most of the procedures are performed with the use of small instruments. Only small incisions are needed to accommodate these instruments, so recovery time is minimized. °The size, location, and chemical composition are all important variables that will determine the proper choice of action for you. Talk to your health care provider to better understand your situation so that you will minimize the risk of injury to yourself and your kidney.  °HOME CARE INSTRUCTIONS  °· Drink enough water and fluids to keep your urine clear or pale yellow. This will help you to pass the stone or stone fragments. °· Strain  all urine through the provided strainer. Keep all particulate matter and stones for your health care provider to see. The stone causing the pain may be as small as a grain of salt. It is very important to use the strainer each and every time you pass your urine. The collection of your stone will allow your health care provider to analyze it and verify that a stone has actually passed. The stone analysis will often identify what you can do to reduce the incidence of recurrences. °· Only take over-the-counter or prescription medicines for pain, discomfort, or fever as directed by your health care provider. °· Keep all follow-up visits as told by your health care provider. This is important. °· Get follow-up X-rays if required. The absence of pain does not always mean that the stone has passed. It may have only stopped moving. If the urine remains completely obstructed, it can cause loss of kidney function or even complete destruction of the kidney. It is your responsibility to make sure X-rays and follow-ups are completed. Ultrasounds of the kidney can show blockages and the status of the kidney. Ultrasounds are not associated with any radiation and can be performed easily in a matter of minutes. °· Make changes to your daily diet as told by your health care provider. You may be told to: °¨ Limit the amount of salt that you eat. °¨ Eat 5 or more servings of fruits and vegetables each day. °¨ Limit the amount of meat, poultry, fish, and eggs that you eat. °· Collect a 24-hour urine sample as told by your health care provider. You may need to collect another urine sample every 6-12   months. °SEEK MEDICAL CARE IF: °· You experience pain that is progressive and unresponsive to any pain medicine you have been prescribed. °SEEK IMMEDIATE MEDICAL CARE IF:  °· Pain cannot be controlled with the prescribed medicine. °· You have a fever or shaking chills. °· The severity or intensity of pain increases over 18 hours and is not  relieved by pain medicine. °· You develop a new onset of abdominal pain. °· You feel faint or pass out. °· You are unable to urinate. °  °This information is not intended to replace advice given to you by your health care provider. Make sure you discuss any questions you have with your health care provider. °  °Document Released: 05/12/2005 Document Revised: 01/31/2015 Document Reviewed: 10/13/2012 °Elsevier Interactive Patient Education ©2016 [employer] Inc. ° °

## 2015-06-13 ENCOUNTER — Ambulatory Visit (INDEPENDENT_AMBULATORY_CARE_PROVIDER_SITE_OTHER): Payer: Managed Care, Other (non HMO) | Admitting: Family Medicine

## 2015-06-13 VITALS — BP 153/102 | HR 90 | Temp 98.5°F | Wt 197.0 lb

## 2015-06-13 DIAGNOSIS — N644 Mastodynia: Secondary | ICD-10-CM

## 2015-06-13 DIAGNOSIS — F17200 Nicotine dependence, unspecified, uncomplicated: Secondary | ICD-10-CM

## 2015-06-13 NOTE — Patient Instructions (Signed)
Thank you for coming in to clinic today.  1. Your breast exam was normal today. - Recommend scheduling a "Routine Screening mammogram", let us know if they require an order or any further assistance with scheduling this - You can do self breast exams once monthly, let us know if any concerns, lumps, bumps, painful, swollen areas, inverted nipple, discharge from nipple  2. Recommend to continue cut back smoking.  Please schedule followup apt with Dr Valentina Lucks ([facility] - Smoking Cessation) anytime in next 1-3 months  Please schedule a follow-up appointment with Dr Parks Ranger anytime in next 3 to 6 months for Physical and Pap Smear  If you have any other questions or concerns, please feel free to call the clinic to contact me. You may also schedule an earlier appointment if necessary.  However, if your symptoms get significantly worse, please go to the Emergency Department to seek immediate medical attention.  [name], [place]

## 2015-06-13 NOTE — Progress Notes (Signed)
Subjective:    Patient ID: Angelica Campbell, female    DOB: 04-29-1971, 45 y.o.   MRN: [id number]  Angelica Campbell is a 45 y.o. female presenting on 06/13/2015 for Breast Pain  Patient presents for a same day appointment.  HPI  BREAST PAIN, BILATERAL, INTERMITTENT: - Today presents with concerns of intermittent breast pain on both sides, brief episodes on most days for past few weeks with "dull mild pain" lasting a few seconds then resolved, occurs 1-2x a day in different locations when it happens, symptoms started at the end of her last menstrual cycle in January. - Menarache age 20, LMP first week of January 2017, regular cycle monthly - Denies feeling any breast masses or swollen lymph nodes. Denies any trauma or injury, unintentional weight loss, night sweats  TOBACCO ABUSE: - chronic history of smoking, currently down to about 1 ppd vs 1 pack over 1.5 days. Never tried to quit before. No trial on medicines. Interested in quitting but not ready.  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 Marital Status: Single    Spouse Name: N/A  . Number of Children: N/A  . Years of Education: N/A   Occupational History  . Not on file.   Social History Main Topics  . Smoking status: Current Every Day Smoker -- 1.00 packs/day    Types: Cigarettes  . Smokeless tobacco: Not on file  . Alcohol Use: Not on file  . Drug Use: Not on file  . Sexual Activity: Not on file   Other Topics Concern  . Not on file   Social History Narrative    Review of Systems Per HPI unless specifically indicated above     Objective:    BP 153/102 mmHg  Pulse 90  Temp(Src) 98.5 F (36.9 C) (Oral)  Wt 197 lb (89.359 kg)  Wt Readings from Last 3 Encounters:  06/13/15 197 lb (89.359 kg)  05/24/15 194 lb (87.998 kg)  08/09/14 184 lb 6.4 oz (83.643 kg)      Physical Exam  Constitutional: She appears well-developed and well-nourished. No distress.  Well-appearing, comfortable  HENT:  Head: Normocephalic and atraumatic.  Neck: Normal range of motion. Neck supple. No thyromegaly present.  Pulmonary/Chest: Right breast exhibits no inverted nipple, no mass, no nipple discharge, no skin change and no tenderness. Left breast exhibits no inverted nipple, no mass, no nipple discharge, no skin change and no tenderness. Breasts are symmetrical.  No palpable axillary lymph nodes.  Lymphadenopathy:    She has no cervical adenopathy.  Neurological: She is alert.  Skin: Skin is warm and dry. No rash noted. She is not diaphoretic.  Nursing note and vitals reviewed.  Breast exam chaperoned by Levert Feinstein, LPN  Results for orders placed or performed in visit on 05/24/15  POCT urinalysis dipstick  Result Value Ref Range   Color, UA yellow    Clarity, UA clear    Glucose, UA neg    Bilirubin, UA neg    Ketones, UA neg    Spec Grav, UA 1.025    Blood, UA neg    pH, UA 6.5    Protein, UA neg    Urobilinogen, UA 0.2    Nitrite, UA neg    Leukocytes, UA Negative Negative      Assessment & Plan:  Problem List Items Addressed This Visit    Pain of both breasts - Primary    Clinically unclear exact etiology but seems secondary or related to menstrual cycle. Suspect benign etiology with brief intermittent bilateral breast pain in various locations. No red flags. No masses, LAD, or skin changes. No prior mammogram before, but does have breast cancer history Mother survivor age 5  Plan: 1. Advised for patient to start routine screening mammograms (never had before), given handout 2. Counseled on self breast exam q 1-3 months 3. Remain alcohol free. Counseled on quitting tobacco, advised to schedule with Dr Valentina Lucks pharmacy clinic 4. Follow-up 3 months for physical, pap smear      TOBACCO DEPENDENCE    Chronic active smoker, increased to about 1 ppd,  >20 years - Currently not ready to quit - Previously quit x 1 year when pregnant (otherwise no successful quits) - No medications or NRT  Plan: 1. Smoking cessation counseling. Advised to mentally prepare and plan for future quit date, taper down on # of cigs daily, seek support 2. Follow-up next with Dr. Valentina Lucks (Pharmacy Clinic - Smoking Cessation)         No orders of the defined types were placed in this encounter.      Follow up plan: Return in about 3 months (around 09/11/2015) for physical, pap smear, [place] smoking cessation.  [name], [place], PGY-3

## 2015-06-14 ENCOUNTER — Encounter: Payer: Self-pay | Admitting: Family Medicine

## 2015-06-14 NOTE — Assessment & Plan Note (Signed)
Chronic active smoker, increased to about 1 ppd, >20 years - Currently not ready to quit - Previously quit x 1 year when pregnant (otherwise no successful quits) - No medications or NRT  Plan: 1. Smoking cessation counseling. Advised to mentally prepare and plan for future quit date, taper down on # of cigs daily, seek support 2. Follow-up next with Dr. Valentina Lucks (Pharmacy Clinic - Smoking Cessation)

## 2015-06-14 NOTE — Assessment & Plan Note (Signed)
Clinically unclear exact etiology but seems secondary or related to menstrual cycle. Suspect benign etiology with brief intermittent bilateral breast pain in various locations. No red flags. No masses, LAD, or skin changes. No prior mammogram before, but does have breast cancer history Mother survivor age 44  Plan: 1. Advised for patient to start routine screening mammograms (never had before), given handout 2. Counseled on self breast exam q 1-3 months 3. Remain alcohol free. Counseled on quitting tobacco, advised to schedule with Dr Valentina Lucks pharmacy clinic 4. Follow-up 3 months for physical, pap smear

## 2016-01-02 ENCOUNTER — Other Ambulatory Visit: Payer: Self-pay | Admitting: Family Medicine

## 2016-01-02 ENCOUNTER — Ambulatory Visit
Admission: RE | Admit: 2016-01-02 | Discharge: 2016-01-02 | Disposition: A | Discharge status: Home / Self Care | Payer: No Typology Code available for payment source | Source: Ambulatory Visit | Attending: Family Medicine | Admitting: Family Medicine

## 2016-01-02 DIAGNOSIS — Z1231 Encounter for screening mammogram for malignant neoplasm of breast: Secondary | ICD-10-CM

## 2016-01-07 ENCOUNTER — Other Ambulatory Visit: Payer: Self-pay | Admitting: Family Medicine

## 2016-01-07 DIAGNOSIS — R928 Other abnormal and inconclusive findings on diagnostic imaging of breast: Secondary | ICD-10-CM

## 2016-01-08 ENCOUNTER — Other Ambulatory Visit (HOSPITAL_COMMUNITY): Payer: Self-pay | Admitting: *Deleted

## 2016-01-08 DIAGNOSIS — R928 Other abnormal and inconclusive findings on diagnostic imaging of breast: Secondary | ICD-10-CM

## 2016-01-17 ENCOUNTER — Ambulatory Visit (HOSPITAL_COMMUNITY)
Admission: RE | Admit: 2016-01-17 | Discharge: 2016-01-17 | Disposition: A | Discharge status: Home / Self Care | Payer: Self-pay | Source: Ambulatory Visit | Attending: Obstetrics and Gynecology | Admitting: Obstetrics and Gynecology

## 2016-01-17 ENCOUNTER — Ambulatory Visit
Admission: RE | Admit: 2016-01-17 | Discharge: 2016-01-17 | Disposition: A | Discharge status: Home / Self Care | Payer: No Typology Code available for payment source | Source: Ambulatory Visit | Attending: Obstetrics and Gynecology | Admitting: Obstetrics and Gynecology

## 2016-01-17 ENCOUNTER — Encounter (HOSPITAL_COMMUNITY): Payer: Self-pay

## 2016-01-17 VITALS — BP 112/70 | Temp 97.9°F | Ht 62.0 in | Wt 201.4 lb

## 2016-01-17 DIAGNOSIS — R928 Other abnormal and inconclusive findings on diagnostic imaging of breast: Secondary | ICD-10-CM

## 2016-01-17 DIAGNOSIS — Z01419 Encounter for gynecological examination (general) (routine) without abnormal findings: Secondary | ICD-10-CM

## 2016-01-17 NOTE — Patient Instructions (Addendum)
Explained breast self awareness with  [name]. Let her know [facility] will cover Pap smears and HPV typing every 5 years unless has a history of abnormal Pap smears. Referred patient to the [place] for a right breast diagnostic mammogram per recommendation. Appointment scheduled for Thursday, January 17, 2016 at 0850. Let patient know will follow up with her within the next couple weeks with results with results of Pap smear by phone. Discussed smoking cessation with patient. Referred to the [facility] Quitline and gave resources to free smoking cessation classes at [facility]. Angelica Campbell verbalized understanding.  [place], [name], RN 8:46 AM

## 2016-01-17 NOTE — Addendum Note (Signed)
Encounter addended by: Loletta Parish, RN on: 01/17/2016  3:09 PM<BR>    Actions taken: Visit diagnoses modified

## 2016-01-17 NOTE — Progress Notes (Signed)
Patient referred to [facility] by the [facility] due to needing additional imaging of the right breast. Screening mammogram completed 01/02/2016.  Pap Smear: Pap smear completed today. Last Pap smear was 12/09/2012 at [facility] and normal. Per patient has no history of an abnormal Pap smear. Last Pap smear result is in EPIC.  Physical exam: Breasts Breasts symmetrical. No skin abnormalities bilateral breasts. No nipple retraction bilateral breasts. No nipple discharge bilateral breasts. No lymphadenopathy. No lumps palpated bilateral breasts. No complaints of pain or tenderness on exam. Referred patient to the [facility] [place] for a right breast diagnostic mammogram per recommendation. Appointment scheduled for Thursday, January 17, 2016 at 0850.  Pelvic/Bimanual   Ext Genitalia No lesions, no swelling and no discharge observed on external genitalia.         Vagina Vagina pink and normal texture. No lesions or discharge observed in vagina.          Cervix Cervix is present. Cervix pink and of normal texture. No discharge observed.     Uterus Uterus is present and palpable. Uterus in normal position and normal size.        Adnexae Bilateral ovaries present and palpable. No tenderness on palpation.          Rectovaginal No rectal exam completed today since patient had no rectal complaints. No skin abnormalities observed on exam.    Smoking History: Patient is a current smoker. Discussed smoking cessation with patient. Referred to the [facility] Quitline and gave resources to free smoking cessation classes at [facility].  Patient Navigation: Patient education provided. Access to services provided for patient through [facility] program.

## 2016-01-18 LAB — CYTOLOGY - PAP

## 2016-01-21 ENCOUNTER — Encounter (HOSPITAL_COMMUNITY): Payer: Self-pay | Admitting: *Deleted

## 2016-01-29 ENCOUNTER — Telehealth (HOSPITAL_COMMUNITY): Payer: Self-pay | Admitting: *Deleted

## 2016-01-29 NOTE — Telephone Encounter (Signed)
Patient called after receiving a message from [place]. 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 Explained the HPV virus to patient. Discussed smoking cessation and gave resources to free classes offered at [facility]. Let patient know she will need a Pap smear and mammogram in one year. Told her if she is still eligible can have both completed in [facility] next year. Patient verbalized understanding.

## 2016-01-29 NOTE — Telephone Encounter (Signed)
Telephoned patient at home number and no answer. 

## 2016-08-05 ENCOUNTER — Encounter: Payer: Self-pay | Admitting: Internal Medicine

## 2016-11-24 ENCOUNTER — Telehealth: Payer: Self-pay | Admitting: Internal Medicine

## 2016-11-24 ENCOUNTER — Other Ambulatory Visit (HOSPITAL_COMMUNITY)
Admission: RE | Admit: 2016-11-24 | Discharge: 2016-11-24 | Disposition: A | Discharge status: Home / Self Care | Payer: No Typology Code available for payment source | Source: Ambulatory Visit | Attending: Family Medicine | Admitting: Family Medicine

## 2016-11-24 ENCOUNTER — Encounter: Payer: Self-pay | Admitting: Internal Medicine

## 2016-11-24 ENCOUNTER — Ambulatory Visit (INDEPENDENT_AMBULATORY_CARE_PROVIDER_SITE_OTHER): Payer: PRIVATE HEALTH INSURANCE | Admitting: Internal Medicine

## 2016-11-24 VITALS — BP 130/90 | HR 98 | Temp 98.4°F | Ht 62.0 in | Wt 196.0 lb

## 2016-11-24 DIAGNOSIS — E669 Obesity, unspecified: Secondary | ICD-10-CM

## 2016-11-24 DIAGNOSIS — F17200 Nicotine dependence, unspecified, uncomplicated: Secondary | ICD-10-CM

## 2016-11-24 DIAGNOSIS — Z0000 Encounter for general adult medical examination without abnormal findings: Secondary | ICD-10-CM | POA: Diagnosis not present

## 2016-11-24 DIAGNOSIS — N898 Other specified noninflammatory disorders of vagina: Secondary | ICD-10-CM

## 2016-11-24 DIAGNOSIS — R87619 Unspecified abnormal cytological findings in specimens from cervix uteri: Secondary | ICD-10-CM | POA: Insufficient documentation

## 2016-11-24 LAB — POCT WET PREP (WET MOUNT)
CLUE CELLS WET PREP WHIFF POC: NEGATIVE
Trichomonas Wet Prep HPF POC: ABSENT

## 2016-11-24 LAB — POCT GLYCOSYLATED HEMOGLOBIN (HGB A1C): Hemoglobin A1C: 5.3

## 2016-11-24 MED ORDER — FLUCONAZOLE 150 MG PO TABS: ORAL_TABLET | ORAL | 0 refills | Status: DC

## 2016-11-24 MED ORDER — NICOTINE 14 MG/24HR TD PT24: 14.0000 mg | MEDICATED_PATCH | Freq: Every day | TRANSDERMAL | 0 refills | Status: DC

## 2016-11-24 NOTE — Patient Instructions (Signed)
Ms. Shiley,  I will call you with your lab results.  For smoking cessation, pick a quit date and apply a 14 mg nicotine patch once daily for 28 days. Then switch to 7 mg nicotine patch daily for 1 month, then trial off.  If you do not like the nicotine patch, I would recommend seeing our Pharmacist Dr. Valentina Lucks to discuss other options.  Best, Dr. Ola Spurr

## 2016-11-24 NOTE — Progress Notes (Signed)
Subjective:   Angelica Campbell is a 46 y.o. female with a history of obesity, anxiety, tobacco abuse, and breast pain here for an annual gynecological exam.   Gyn concerns/Preventative healthcare  Last menstrual period: Patient's last menstrual period was 11/05/2016.  Regular periods: yes  Heavy bleeding: no  Sexually active: yes  Hx of STD: Patient does not desire STD screening  Dyspareunia: No  Hot flashes: No  Vaginal discharge: has noticed increased smell with menses  Dysuria:No  Last mammogram: 12/2015 (increased fibroglandular density)  Breast mass or concerns: No but mother had breast cancer and was 52 when diagnoses  Last pap smear: 12/2015 without intraepithelial lesions or malignancy but high risk HPV detected  Regarding obesity, patient identifies sweet tea as a problem area in her diet but would like to focus on quitting smoking first rather than making dietary changes. Enjoys walking.   Health maintenance: Patient declines HIV testing; agrees to hgb A1c and lipid panel as has not been screened recently and brother and mother have diabetes  PMH, Surgical Hx, Family Hx, Social History reviewed and updated as below.  Family History  Problem Relation Age of Onset  . Breast cancer Mother 37  . Hypertension Mother   . Diabetes Brother    Social:  - Smokes half a pack per day; would like to quit - does not regularly exercise but has a physical job assembling gas pumps - Does not drink or use illegal drugs - Feels safe in her relationships - PHQ-2: 0   Review of Systems:  Per HPI. Otherwise a complete 10 point ROS was negative.    Objective:  Blood pressure 130/90, pulse 98, temperature 98.4 F (36.9 C), temperature source Oral, height 5\' 2"  (1.575 m), weight 196 lb (88.9 kg), last menstrual period 11/05/2016, SpO2 99 %. Body mass index is 35.85 kg/m. Exam: General: well appearing, NAD. HEENT: NCAT. Cardiovascular: RRR. No murmurs, rubs, or  gallops. Respiratory: CTAB. No rales, rhonchi, or wheeze. Abdomen: soft, nontender, nondistended. Extremities: warm, well perfused. No LE edema. Skin: Warm, dry, intact. Neuro: No focal deficits.  Pelvic Exam:        External: normal female genitalia without lesions or masses        Vagina: normal without lesions or masses; moderate amount of white discharge        Cervix: normal without lesions or masses; minimal blood at os        Pap smear: performed        Samples for Wet prep obtained  Assessment/Plan:  Vaginal discharge - Obtained wet prep with pap today that showed moderate yeast - Ordered diflucan for patient to take if symptomatic  TOBACCO DEPENDENCE - Patient interested in quitting  - Ordered nicotine patches. Instructed patient to pick a quit date and start with 14 mg nicotine patch x 1 month, then decrease to 7 mg x 1 month - Let her know about Dr. Valentina Lucks as a resource, as well  Well adult exam - Ordered routine screening labs for HLD and diabetes, as patient has risk factors of positive family history and is obese - Performed pap smear, given patient's abnormal results last year - Patient declines HIV  - Patient to schedule annual mammogram for this August  OBESITY, MODERATE - Intends to increase exercise with more regular walking - Eventually plans to decrease sugary beverage intake but prefers to focus on smoking cessation at this time  Olene Floss, MD [place], PGY-3

## 2016-11-24 NOTE — Telephone Encounter (Signed)
Pt is returning Dr. Blane Ohara call. She has seen her earlier today and missed a call from her. jw

## 2016-11-25 LAB — LIPID PANEL
CHOLESTEROL TOTAL: 169 mg/dL (ref 100–199)
Chol/HDL Ratio: 4.4 ratio (ref 0.0–4.4)
HDL: 38 mg/dL — ABNORMAL LOW (ref >39)
LDL CALC: 116 mg/dL — AB (ref 0–99)
Triglycerides: 73 mg/dL (ref 0–149)
VLDL Cholesterol Cal: 15 mg/dL (ref 5–40)

## 2016-11-25 LAB — CYTOLOGY - PAP
DIAGNOSIS: NEGATIVE
HPV (WINDOPATH): NOT DETECTED

## 2016-11-26 NOTE — Assessment & Plan Note (Signed)
-   Obtained wet prep with pap today that showed moderate yeast - Ordered diflucan for patient to take if symptomatic

## 2016-11-26 NOTE — Assessment & Plan Note (Addendum)
-   Ordered routine screening labs for HLD and diabetes, as patient has risk factors of positive family history and is obese - Performed pap smear, given patient's abnormal results last year - Patient declines HIV  - Patient to schedule annual mammogram for this August

## 2016-11-26 NOTE — Assessment & Plan Note (Signed)
-   Intends to increase exercise with more regular walking - Eventually plans to decrease sugary beverage intake but prefers to focus on smoking cessation at this time

## 2016-11-26 NOTE — Assessment & Plan Note (Signed)
-   Patient interested in quitting  - Ordered nicotine patches. Instructed patient to pick a quit date and start with 14 mg nicotine patch x 1 month, then decrease to 7 mg x 1 month - Let her know about Dr. Valentina Lucks as a resource, as well

## 2016-11-27 ENCOUNTER — Encounter: Payer: Self-pay | Admitting: Internal Medicine

## 2016-12-08 ENCOUNTER — Other Ambulatory Visit: Payer: Self-pay | Admitting: Internal Medicine

## 2016-12-08 DIAGNOSIS — Z1231 Encounter for screening mammogram for malignant neoplasm of breast: Secondary | ICD-10-CM

## 2017-01-05 ENCOUNTER — Ambulatory Visit
Admission: RE | Admit: 2017-01-05 | Discharge: 2017-01-05 | Disposition: A | Discharge status: Home / Self Care | Payer: PRIVATE HEALTH INSURANCE | Source: Ambulatory Visit | Attending: Family Medicine | Admitting: Family Medicine

## 2017-01-05 DIAGNOSIS — Z1231 Encounter for screening mammogram for malignant neoplasm of breast: Secondary | ICD-10-CM

## 2017-11-24 ENCOUNTER — Other Ambulatory Visit (HOSPITAL_COMMUNITY)
Admission: RE | Admit: 2017-11-24 | Discharge: 2017-11-24 | Disposition: A | Discharge status: Home / Self Care | Payer: BLUE CROSS/BLUE SHIELD | Source: Ambulatory Visit | Attending: Family Medicine | Admitting: Family Medicine

## 2017-11-24 ENCOUNTER — Other Ambulatory Visit: Payer: Self-pay

## 2017-11-24 ENCOUNTER — Ambulatory Visit (INDEPENDENT_AMBULATORY_CARE_PROVIDER_SITE_OTHER): Payer: BLUE CROSS/BLUE SHIELD | Admitting: Family Medicine

## 2017-11-24 ENCOUNTER — Encounter: Payer: Self-pay | Admitting: Family Medicine

## 2017-11-24 VITALS — BP 104/66 | HR 82 | Temp 98.4°F | Ht 62.0 in | Wt 198.4 lb

## 2017-11-24 DIAGNOSIS — B379 Candidiasis, unspecified: Secondary | ICD-10-CM

## 2017-11-24 DIAGNOSIS — Z0000 Encounter for general adult medical examination without abnormal findings: Secondary | ICD-10-CM | POA: Insufficient documentation

## 2017-11-24 LAB — POCT WET PREP (WET MOUNT)
CLUE CELLS WET PREP WHIFF POC: NEGATIVE
Trichomonas Wet Prep HPF POC: ABSENT

## 2017-11-24 NOTE — Patient Instructions (Addendum)
It was great meeting you today! We performed a pap smear and a wet prep to check your cervix and see if you had BV again. I am glad that things have been going well. WE discussed getting some lab work drawn to look at your blood counts and your kidney/liver function along with a tetanus vaccine. You can always come back and have this done at a nurses visit if you change your mind and do want these draw.  I will call you with the results of your pap smear and wet prep. These should both be back by 7/3. If you results of the pap are normal we can probably space the pap smears back out to every three years.

## 2017-11-24 NOTE — Progress Notes (Signed)
   HPI 47 year old who presents for annual wellness exam. She requests pap smear as she had an abnormal result 2 years ago and has been getting them yearly since that time. She also says that she has intermittently had foul-smelling discharge from her vagina. She has not had characteristic itching of a yeast infection.  Discussed getting screening labs with patient including cbc, cmp, tsh. Also discussed tdap and hiv screen. Patient refused these for now, but states she will let me know if she changes her mind.  CC: Annual wellness   ROS: Review of Systems  Constitutional: Negative for chills and fever.  Eyes: Negative for pain.  Respiratory: Negative for cough and hemoptysis.   Cardiovascular: Negative for chest pain and palpitations.  Gastrointestinal: Negative for abdominal pain, constipation, diarrhea, nausea and vomiting.  Genitourinary:       Foul odor from vagina  Musculoskeletal: Negative for myalgias and neck pain.  Skin: Negative for itching and rash.  Neurological: Negative for dizziness and headaches.    Review of Systems See HPI for ROS.   CC, SH/smoking status, and VS noted  Objective: BP 104/66   Pulse 82   Temp 98.4 F (36.9 C) (Oral)   Ht 5\' 2"  (1.575 m)   Wt 198 lb 6.4 oz (90 kg)   LMP 10/29/2017   SpO2 99%   BMI 36.29 kg/m  Gen: NAD, alert, cooperative, and pleasant. Well appearing AA female resting comfortably in bed HEENT: NCAT, EOMI, PERRL CV: RRR, no murmur Resp: CTAB, no wheezes, non-labored Abd: SNTND, BS present, no guarding or organomegaly Ext: No edema, warm Neuro: Alert and oriented, Speech clear, No gross deficits Gyn: No increased pain on exam, no cervical motion tenderness,    Assessment and plan:  Health care maintenance Patient continues to do well. Performed pap smear which was negative for any abnormality in transition zone.  Can likely space these to every 3 years ago given 2 normal paps last 2 years. Deferred tdap and hiv  testing. Deferred any additional lab work based on patient prefererence. - follow up for annual wellness exam in 1 year  Yeast infection Wet prep confirms yeasts on exam and a positive whiff on koh. Will treat with diflucan 150mg  once, repeat dosing in 3 days if still having symptoms.   Orders Placed This Encounter  Procedures  . POCT Wet Prep [facility])    Meds ordered this encounter  Medications  . fluconazole (DIFLUCAN) 150 MG tablet    Sig: Take 1 tablet. Repeat in 72 hours if still having symptoms.    Dispense:  2 tablet    Refill:  0    Guadalupe Dawn MD PGY-2 Family Medicine Resident  11/27/2017 11:30 AM

## 2017-11-25 LAB — CYTOLOGY - PAP
Adequacy: ABSENT
Diagnosis: NEGATIVE

## 2017-11-25 MED ORDER — FLUCONAZOLE 150 MG PO TABS: ORAL_TABLET | ORAL | 0 refills | Status: DC

## 2017-11-27 ENCOUNTER — Encounter: Payer: Self-pay | Admitting: Family Medicine

## 2017-11-27 DIAGNOSIS — B379 Candidiasis, unspecified: Secondary | ICD-10-CM | POA: Insufficient documentation

## 2017-11-27 NOTE — Assessment & Plan Note (Signed)
Wet prep confirms yeasts on exam and a positive whiff on koh. Will treat with diflucan 150mg  once, repeat dosing in 3 days if still having symptoms.

## 2017-11-27 NOTE — Assessment & Plan Note (Signed)
Patient continues to do well. Performed pap smear which was negative for any abnormality in transition zone.  Can likely space these to every 3 years ago given 2 normal paps last 2 years. Deferred tdap and hiv testing. Deferred any additional lab work based on patient prefererence. - follow up for annual wellness exam in 1 year

## 2017-12-10 ENCOUNTER — Other Ambulatory Visit: Payer: Self-pay | Admitting: Family Medicine

## 2017-12-10 DIAGNOSIS — Z1231 Encounter for screening mammogram for malignant neoplasm of breast: Secondary | ICD-10-CM

## 2018-01-06 ENCOUNTER — Ambulatory Visit
Admission: RE | Admit: 2018-01-06 | Discharge: 2018-01-06 | Disposition: A | Discharge status: Home / Self Care | Payer: BLUE CROSS/BLUE SHIELD | Source: Ambulatory Visit | Attending: Family Medicine | Admitting: Family Medicine

## 2018-01-06 DIAGNOSIS — Z1231 Encounter for screening mammogram for malignant neoplasm of breast: Secondary | ICD-10-CM

## 2018-12-06 ENCOUNTER — Other Ambulatory Visit: Payer: Self-pay | Admitting: Family Medicine

## 2018-12-06 DIAGNOSIS — Z1231 Encounter for screening mammogram for malignant neoplasm of breast: Secondary | ICD-10-CM

## 2018-12-21 ENCOUNTER — Other Ambulatory Visit (HOSPITAL_COMMUNITY)
Admission: RE | Admit: 2018-12-21 | Discharge: 2018-12-21 | Disposition: A | Discharge status: Home / Self Care | Payer: BC Managed Care – PPO | Source: Ambulatory Visit | Attending: Family Medicine | Admitting: Family Medicine

## 2018-12-21 ENCOUNTER — Encounter: Payer: Self-pay | Admitting: Family Medicine

## 2018-12-21 ENCOUNTER — Other Ambulatory Visit: Payer: Self-pay

## 2018-12-21 ENCOUNTER — Ambulatory Visit (INDEPENDENT_AMBULATORY_CARE_PROVIDER_SITE_OTHER): Payer: BC Managed Care – PPO | Admitting: Family Medicine

## 2018-12-21 VITALS — BP 118/78 | HR 97 | Temp 98.7°F | Ht 62.0 in | Wt 200.0 lb

## 2018-12-21 DIAGNOSIS — Z124 Encounter for screening for malignant neoplasm of cervix: Secondary | ICD-10-CM | POA: Diagnosis present

## 2018-12-21 DIAGNOSIS — M25562 Pain in left knee: Secondary | ICD-10-CM

## 2018-12-21 DIAGNOSIS — G8929 Other chronic pain: Secondary | ICD-10-CM | POA: Diagnosis not present

## 2018-12-21 DIAGNOSIS — Z0000 Encounter for general adult medical examination without abnormal findings: Secondary | ICD-10-CM

## 2018-12-21 NOTE — Patient Instructions (Addendum)
It was great seeing you again today!  We performed a Pap smear.  I will give you call back in the next 2 to 3 days the results.  We discussed your past mammograms.  Be sure to go to your next one in mid to late August.  If you have trouble interpreting the read please let me know on my chart I will be happy to help explain things for you.  Also performed a breast exam which felt good with no abnormalities that I could appreciate.

## 2018-12-23 DIAGNOSIS — M1712 Unilateral primary osteoarthritis, left knee: Secondary | ICD-10-CM | POA: Insufficient documentation

## 2018-12-23 NOTE — Assessment & Plan Note (Signed)
Repeated Pap smear.  Patient overdue for Tdap and HIV.  Declined both the screenings.  No abnormality on exam aside from left knee.

## 2018-12-23 NOTE — Progress Notes (Signed)
   HPI 48 year old female presents for annual physical exam.  Patient wishes to have repeat Pap if that was only done 1 year ago.  Upon review of the Pap smear the transition zone was absent.  Patient states that she is also been having intermittent left knee pain.  States that it is sometimes dull, very intermittent, and she has noticed a popping and cracking to the knee.  Patient states that she did have some trauma to the knee about 20 years ago "back in her more wild days".  He also expressed desire for rotation of her to most recent mammograms.  Discussed both of the readings with her.  She has her next mammogram due in August.  CC: Annual physical   ROS:   Review of Systems See HPI for ROS.   CC, SH/smoking status, and VS noted  Objective: BP 118/78   Pulse 97   Temp 98.7 F (37.1 C) (Oral)   Ht 5\' 2"  (1.575 m)   Wt 200 lb (90.7 kg)   LMP 11/30/2018   SpO2 99%   BMI 36.58 kg/m  Gen: 48 year old African-American female, no acute distress, resting comfortably HEENT: EOMI, PERRLA.  No cervical lymphadenopathy. CV: RRR, no murmur Resp: CTAB, no wheezes, non-labored Neuro: Alert and oriented, Speech clear, No gross deficits GU: Speculum easily inserted.  No discharge noted.  Cervix easily visualized. Left knee: Mild swelling left knee.  No palpable tenderness.  Popping grinding noted with flexion extension of leg.  No range of motion or strength limitations.   Assessment and plan:  Health care maintenance Repeated Pap smear.  Patient overdue for Tdap and HIV.  Declined both the screenings.  No abnormality on exam aside from left knee.  Left knee pain Patient states that she had mild trauma to this knee.  Has developed subsequent dull achy pain, profound popping and grinding on flexion extension.  Likely knee osteoarthritis.  Patient opted to defer any work-up for this for the time being.  States is not bothering her a lot.  Likely needs x-ray patient's pain gets worse.    No orders of the defined types were placed in this encounter.   No orders of the defined types were placed in this encounter.    Guadalupe Dawn MD PGY-3 Family Medicine Resident  12/23/2018 12:01 PM

## 2018-12-23 NOTE — Assessment & Plan Note (Signed)
Patient states that she had mild trauma to this knee.  Has developed subsequent dull achy pain, profound popping and grinding on flexion extension.  Likely knee osteoarthritis.  Patient opted to defer any work-up for this for the time being.  States is not bothering her a lot.  Likely needs x-ray patient's pain gets worse.

## 2018-12-24 LAB — CYTOLOGY - PAP
Adequacy: ABSENT
Diagnosis: NEGATIVE
HPV: NOT DETECTED

## 2019-01-17 ENCOUNTER — Ambulatory Visit
Admission: RE | Admit: 2019-01-17 | Discharge: 2019-01-17 | Disposition: A | Discharge status: Home / Self Care | Payer: BC Managed Care – PPO | Source: Ambulatory Visit | Attending: Family Medicine | Admitting: Family Medicine

## 2019-01-17 ENCOUNTER — Other Ambulatory Visit: Payer: Self-pay

## 2019-01-17 DIAGNOSIS — Z1231 Encounter for screening mammogram for malignant neoplasm of breast: Secondary | ICD-10-CM

## 2019-02-04 ENCOUNTER — Encounter: Payer: Self-pay | Admitting: Family Medicine

## 2019-02-07 ENCOUNTER — Other Ambulatory Visit: Payer: Self-pay

## 2019-02-07 ENCOUNTER — Telehealth: Payer: Self-pay | Admitting: Family Medicine

## 2019-02-07 ENCOUNTER — Encounter: Payer: Self-pay | Admitting: Family Medicine

## 2019-02-07 ENCOUNTER — Ambulatory Visit
Admission: RE | Admit: 2019-02-07 | Discharge: 2019-02-07 | Disposition: A | Discharge status: Home / Self Care | Payer: BC Managed Care – PPO | Source: Ambulatory Visit | Attending: Family Medicine | Admitting: Family Medicine

## 2019-02-07 DIAGNOSIS — M25562 Pain in left knee: Secondary | ICD-10-CM

## 2019-02-07 DIAGNOSIS — G8929 Other chronic pain: Secondary | ICD-10-CM

## 2019-02-07 NOTE — Telephone Encounter (Signed)
Xray order placed for chronic left knee pain at patient request  Guadalupe Dawn MD PGY-3 Family Medicine Resident

## 2019-02-09 ENCOUNTER — Ambulatory Visit (INDEPENDENT_AMBULATORY_CARE_PROVIDER_SITE_OTHER): Payer: BC Managed Care – PPO | Admitting: Family Medicine

## 2019-02-09 ENCOUNTER — Other Ambulatory Visit: Payer: Self-pay

## 2019-02-09 DIAGNOSIS — M1712 Unilateral primary osteoarthritis, left knee: Secondary | ICD-10-CM | POA: Diagnosis not present

## 2019-02-09 MED ORDER — KNEE BRACE MISC: 1.0000 | 0 refills | Status: DC | PRN

## 2019-02-09 MED ORDER — MELOXICAM 15 MG PO TABS: 15.0000 mg | ORAL_TABLET | Freq: Every day | ORAL | 0 refills | Status: DC

## 2019-02-09 NOTE — Patient Instructions (Addendum)
It was great seeing you again today!  We reviewed your x-rays to confirm that you do have bad osteoarthritis in your left knee.  Likely due to past traumatic injuries you had on that knee.  We discussed various treatment options and settled on doing meloxicam 15 mg daily for about a month.  I also gave you some strengthening exercises for her quadriceps which should hopefully increase the stability of your knee.  Also try topical liniment such as Voltaren gel, icy hot, salon pause or any of the other ones.  I think wearing a knee brace at work will help provide some support take a little the pressure off that knee.  I will fill out your paperwork for her job and get that faxed in probably later this afternoon.  You can also continue to take Tylenol 325 mg as needed on top of the meloxicam if you need more relief.  We can give this treatment plan a few weeks if you are not really getting any benefit you can let me know and we can reevaluate other options such as a joint injection.  [place] 3[address]5   (52)  Medical supply store [place], [place] Open ? Closes 6PM  [phone]  [facility] Supply 2.0   (3)  Medical supply store [address] Open ? Closes 6PM  [phone]  [place] 4.6   [phone])  Medical supply store [place] Open ? Closes 5:30PM  [phone]  In-store shopping  [place] 3.5   [phone])  Medical supply store [address] Open ? Closes 6PM  [phone]  In-store shopping  [place] 4.0   (2)  Medical supply store [address] [phone]  [place] 2.7   (28)  Medical supply store [address] Closes soon ? [place]  [phone]

## 2019-02-11 ENCOUNTER — Telehealth: Payer: Self-pay | Admitting: Family Medicine

## 2019-02-11 ENCOUNTER — Encounter: Payer: Self-pay | Admitting: Family Medicine

## 2019-02-11 NOTE — Telephone Encounter (Signed)
Pt was calling to check the status of her FMLA paperwork. Pt said that she spoke with employer and they had not received it yet and she is needing it as soon as possible.   Pt would like for someone to call her when paper work is done and has been faxed.

## 2019-02-12 ENCOUNTER — Encounter: Payer: Self-pay | Admitting: Family Medicine

## 2019-02-12 NOTE — Assessment & Plan Note (Signed)
Patient with moderate to severe osteoarthritis of left knee seen on x-ray.  Discussed numerous options with patient including corticosteroid injection, NSAIDs, quadricep strengthening exercises, and long-term solution of a left knee replacement.  Patient opted to try home quadriceps strengthening exercises and meloxicam.  Patient to follow-up PRN for this issue .

## 2019-02-12 NOTE — Progress Notes (Signed)
   HPI 48 year old female who presents with left leg pain.  Patient last seen for this issue on 7/28 underwent x-rays of her left knee.  Has significant joint space narrowing and significant osteophyte formation within her patella.  Showed patient her x-ray discussed these findings in detail with her.  She works mostly on her feet and works 11 to 12 hours 6 or 7 days/week.  She has had a past traumatic injuries to that knee likely contributing to her osteoarthritis.  CC: Left knee pain   ROS:   Review of Systems See HPI for ROS.   CC, SH/smoking status, and VS noted  Objective: BP 125/80   Pulse 82   Wt 205 lb 3.2 oz (93.1 kg)   LMP 01/26/2019   SpO2 98%   BMI 37.53 kg/m  Gen: 48 year old African-American female, no acute distress, resting comfortably CV: RRR, no murmur Resp: CTAB, no wheezes, non-labored Ext: No edema, warm Neuro: Alert and oriented, Speech clear, No gross deficits Left knee: Mild swelling left knee.  No palpable tenderness.  Popping grinding noted with flexion extension of leg.  No range of motion or strength limitations.   Assessment and plan:  Osteoarthritis of left knee Patient with moderate to severe osteoarthritis of left knee seen on x-ray.  Discussed numerous options with patient including corticosteroid injection, NSAIDs, quadricep strengthening exercises, and long-term solution of a left knee replacement.  Patient opted to try home quadriceps strengthening exercises and meloxicam.  Patient to follow-up PRN for this issue .   No orders of the defined types were placed in this encounter.   Meds ordered this encounter  Medications  . meloxicam (MOBIC) 15 MG tablet    Sig: Take 1 tablet (15 mg total) by mouth daily.    Dispense:  30 tablet    Refill:  0  . Elastic Bandages & Supports (KNEE BRACE) MISC    Sig: 1 each by Does not apply route as needed.    Dispense:  1 each    Refill:  0   Guadalupe Dawn MD PGY-3 Family Medicine Resident   02/12/2019 11:30 PM

## 2019-02-13 NOTE — Telephone Encounter (Signed)
fmla paperwork filled out and placed in outgoing fax pile  Guadalupe Dawn MD PGY-3 Family Medicine Resident

## 2019-03-03 ENCOUNTER — Other Ambulatory Visit: Payer: Self-pay | Admitting: Family Medicine

## 2019-04-05 ENCOUNTER — Other Ambulatory Visit: Payer: Self-pay | Admitting: Family Medicine

## 2019-05-12 ENCOUNTER — Other Ambulatory Visit: Payer: Self-pay | Admitting: Family Medicine

## 2019-06-09 ENCOUNTER — Other Ambulatory Visit: Payer: Self-pay | Admitting: Family Medicine

## 2019-07-03 ENCOUNTER — Other Ambulatory Visit: Payer: Self-pay | Admitting: Family Medicine

## 2019-08-11 ENCOUNTER — Encounter: Payer: Self-pay | Admitting: Family Medicine

## 2019-08-11 ENCOUNTER — Other Ambulatory Visit: Payer: Self-pay

## 2019-08-11 ENCOUNTER — Ambulatory Visit (INDEPENDENT_AMBULATORY_CARE_PROVIDER_SITE_OTHER): Payer: BC Managed Care – PPO | Admitting: Family Medicine

## 2019-08-11 DIAGNOSIS — M1712 Unilateral primary osteoarthritis, left knee: Secondary | ICD-10-CM | POA: Diagnosis not present

## 2019-08-11 MED ORDER — MELOXICAM 15 MG PO TABS: 15.0000 mg | ORAL_TABLET | Freq: Every day | ORAL | 0 refills | Status: DC

## 2019-08-11 MED ORDER — KNEE BRACE MISC: 1.0000 | 0 refills | Status: AC | PRN

## 2019-08-11 NOTE — Patient Instructions (Addendum)
It was great seeing you again today!  I gave you a placard for handicap placement. This will need to be renewed in 5 years. I refilled you meloxicam today! I will get your fmla paperwork sent in to you and your employer. We discussed some options for your knee pain down the road.   Medical supply store locations in the area  [place] 3[address]5   (52)  Medical supply store [place], [place] Open ? Closes 6PM  [phone]  [facility] Supply 2.0   (3)  Medical supply store [address] Open ? Closes 6PM  [phone]  [place] 4.6   [phone])  Medical supply store [place] Open ? Closes 5:30PM  [phone]             In-store shopping  [place] 3.5   [phone])  Medical supply store [address] Open ? Closes 6PM  [phone]             In-store shopping  [place] 4.0   (2)  Medical supply store [address] [phone]  [place] 2.7   (28)  Medical supply store [address] Closes soon ? [place]  [phone]

## 2019-08-14 ENCOUNTER — Encounter: Payer: Self-pay | Admitting: Family Medicine

## 2019-08-14 NOTE — Progress Notes (Signed)
   CHIEF COMPLAINT / HPI: 49 year old female with known left knee osteoarthritis. The patient was granted FMLA to decrease her working hours to only 40 hours per week and to allow for small breaks throughout the day to rest her knee. She works at a [employer] on hard [employer] all day.  PERTINENT  PMH / PSH: known left knee osteoarthritis   OBJECTIVE: BP 118/82   Pulse 85   Wt 210 lb (95.3 kg)   SpO2 99%   BMI 38.41 kg/m   Gen: 49 year old female, no acute distress CV: skin warm and dry Resp: no accessory muscle use Neuro: Alert and oriented, Speech clear, No gross deficits  Left knee: notable popping and clicking with flexion and extension of the left knee  ASSESSMENT / PLAN:  Osteoarthritis of left knee Known left knee OA. Renewed fmla paperwork, refilled meloxicam.     Guadalupe Dawn, MD [place]

## 2019-08-14 NOTE — Assessment & Plan Note (Signed)
Known left knee OA. Renewed fmla paperwork, refilled meloxicam.

## 2019-10-05 ENCOUNTER — Other Ambulatory Visit: Payer: Self-pay | Admitting: Family Medicine

## 2019-10-31 ENCOUNTER — Other Ambulatory Visit: Payer: Self-pay | Admitting: Family Medicine

## 2019-12-16 ENCOUNTER — Other Ambulatory Visit: Payer: Self-pay | Admitting: Family Medicine

## 2019-12-16 DIAGNOSIS — Z1231 Encounter for screening mammogram for malignant neoplasm of breast: Secondary | ICD-10-CM

## 2019-12-19 ENCOUNTER — Other Ambulatory Visit: Payer: Self-pay

## 2019-12-19 ENCOUNTER — Ambulatory Visit (INDEPENDENT_AMBULATORY_CARE_PROVIDER_SITE_OTHER): Payer: BC Managed Care – PPO | Admitting: Student in an Organized Health Care Education/Training Program

## 2019-12-19 ENCOUNTER — Encounter: Payer: Self-pay | Admitting: Student in an Organized Health Care Education/Training Program

## 2019-12-19 VITALS — BP 110/80 | HR 96 | Wt 207.0 lb

## 2019-12-19 DIAGNOSIS — Z0000 Encounter for general adult medical examination without abnormal findings: Secondary | ICD-10-CM

## 2019-12-19 DIAGNOSIS — M1712 Unilateral primary osteoarthritis, left knee: Secondary | ICD-10-CM

## 2019-12-19 DIAGNOSIS — Z124 Encounter for screening for malignant neoplasm of cervix: Secondary | ICD-10-CM | POA: Diagnosis not present

## 2019-12-19 NOTE — Patient Instructions (Signed)
It was a pleasure to see you today!  To summarize our discussion for this visit:  I will contact you with your pap smear results  All things looked normal on your breast and pelvic exam today  Please let me know if you consider getting your Tdap or covid vaccine in the future and we can administer those both at our office  We will discuss stopping smoking again at your next appointment  Some additional health maintenance measures we should update are: Health Maintenance Due  Topic Date Due   Hepatitis C Screening  Never done   COVID-19 Vaccine (1) Never done   HIV Screening  Never done   TETANUS/TDAP  02/26/2013     Please return to our clinic to see me in a year or sooner as needed.  Call the clinic at [phone] if your symptoms worsen or you have any concerns.   Thank you for allowing me to take part in your care,  Dr. Doristine Mango  COVID-19 Vaccine Information can be found at: [email or web address] For questions related to vaccine distribution or appointments, please email [email or web address] or call [phone].    Steps to Quit Smoking Smoking tobacco is the leading cause of preventable death. It can affect almost every organ in the body. Smoking puts you and people around you at risk for many serious, long-lasting (chronic) diseases. Quitting smoking can be hard, but it is one of the best things that you can do for your health. It is never too late to quit. How do I get ready to quit? When you decide to quit smoking, make a plan to help you succeed. Before you quit:  Pick a date to quit. Set a date within the next 2 weeks to give you time to prepare.  Write down the reasons why you are quitting. Keep this list in places where you will see it often.  Tell your family, friends, and co-workers that you are quitting. Their support is important.  Talk with your doctor about the choices that may help  you quit.  Find out if your health insurance will pay for these treatments.  Know the people, places, things, and activities that make you want to smoke (triggers). Avoid them. What first steps can I take to quit smoking?  Throw away all cigarettes at home, at work, and in your car.  Throw away the things that you use when you smoke, such as ashtrays and lighters.  Clean your car. Make sure to empty the ashtray.  Clean your home, including curtains and carpets. What can I do to help me quit smoking? Talk with your doctor about taking medicines and seeing a counselor at the same time. You are more likely to succeed when you do both.  If you are pregnant or breastfeeding, talk with your doctor about counseling or other ways to quit smoking. Do not take medicine to help you quit smoking unless your doctor tells you to do so. To quit smoking: Quit right away  Quit smoking totally, instead of slowly cutting back on how much you smoke over a period of time.  Go to counseling. You are more likely to quit if you go to counseling sessions regularly. Take medicine You may take medicines to help you quit. Some medicines need a prescription, and some you can buy over-the-counter. Some medicines may contain a drug called nicotine to replace the nicotine in cigarettes. Medicines may:  Help you to stop having the desire to smoke (cravings).  Help to  stop the problems that come when you stop smoking (withdrawal symptoms). Your doctor may ask you to use:  Nicotine patches, gum, or lozenges.  Nicotine inhalers or sprays.  Non-nicotine medicine that is taken by mouth. Find resources Find resources and other ways to help you quit smoking and remain smoke-free after you quit. These resources are most helpful when you use them often. They include:  Online chats with a Social worker.  Phone quitlines.  Printed Furniture conservator/restorer.  Support groups or group counseling.  Text messaging  programs.  Mobile phone apps. Use apps on your mobile phone or tablet that can help you stick to your quit plan. There are many free apps for mobile phones and tablets as well as websites. Examples include Quit Guide from the [employer] and smokefree.gov  What things can I do to make it easier to quit?   Talk to your family and friends. Ask them to support and encourage you.  Call a phone quitline (1-800-QUIT-NOW), reach out to support groups, or work with a Social worker.  Ask people who smoke to not smoke around you.  Avoid places that make you want to smoke, such as: ? Bars. ? Parties. ? Smoke-break areas at work.  Spend time with people who do not smoke.  Lower the stress in your life. Stress can make you want to smoke. Try these things to help your stress: ? Getting regular exercise. ? Doing deep-breathing exercises. ? Doing yoga. ? Meditating. ? Doing a body scan. To do this, close your eyes, focus on one area of your body at a time from head to toe. Notice which parts of your body are tense. Try to relax the muscles in those areas. How will I feel when I quit smoking? Day 1 to 3 weeks Within the first 24 hours, you may start to have some problems that come from quitting tobacco. These problems are very bad 2-3 days after you quit, but they do not often last for more than 2-3 weeks. You may get these symptoms:  Mood swings.  Feeling restless, nervous, angry, or annoyed.  Trouble concentrating.  Dizziness.  Strong desire for high-sugar foods and nicotine.  Weight gain.  Trouble pooping (constipation).  Feeling like you may vomit (nausea).  Coughing or a sore throat.  Changes in how the medicines that you take for other issues work in your body.  Depression.  Trouble sleeping (insomnia). Week 3 and afterward After the first 2-3 weeks of quitting, you may start to notice more positive results, such as:  Better sense of smell and taste.  Less coughing and sore  throat.  Slower heart rate.  Lower blood pressure.  Clearer skin.  Better breathing.  Fewer sick days. Quitting smoking can be hard. Do not give up if you fail the first time. Some people need to try a few times before they succeed. Do your best to stick to your quit plan, and talk with your doctor if you have any questions or concerns. Summary  Smoking tobacco is the leading cause of preventable death. Quitting smoking can be hard, but it is one of the best things that you can do for your health.  When you decide to quit smoking, make a plan to help you succeed.  Quit smoking right away, not slowly over a period of time.  When you start quitting, seek help from your doctor, family, or friends. This information is not intended to replace advice given to you by your health care provider. Make  sure you discuss any questions you have with your health care provider. Document Revised: 02/04/2019 Document Reviewed: 07/31/2018 Elsevier Patient Education  [place].

## 2019-12-19 NOTE — Assessment & Plan Note (Addendum)
Normal exam.  Pap collected today. Will contact with results

## 2019-12-19 NOTE — Assessment & Plan Note (Signed)
Declined covid vaccine Declined tdap vaccine Requested getting pap smear today after counseling about the recommended guidelines for q3 years with normal pap history. Due for mammogram next month and is scheduled. Mother with h/o breast cancer

## 2019-12-19 NOTE — Progress Notes (Signed)
    SUBJECTIVE:   CHIEF COMPLAINT / HPI: pap  Health maintenance: patient requests a pap smear today. Denies any current symptoms or concerns. Tdap offered. covid vaccine offered. Tobacco cessation counseling offered.  Menopause- patient still having regular periods monthly. LMP about 3 weeks ago. Menarche at age 49. Denies other symptoms. Has 2 adult children.  Works at company that requires long hours and needs [employer] paperwork q6 months. She will return in September for this.  Knee osteoarthritis- pain in both knees. Worse on left.  OBJECTIVE:   BP 110/80   Pulse 96   Wt (!) 207 lb (93.9 kg)   LMP 11/20/2019   SpO2 99%   BMI 37.86 kg/m   General: NAD, pleasant, able to participate in exam Pelvic: normal female genitalia without lesions. Bimanual exam negative for masses or tenderness. Normal breast tissue without concerning masses. Fibroadenomas present bilaterally. Extremities: no edema or cyanosis. WWP. crepitis appreciated with flexion/extension of left knee. Skin: warm and dry, no rashes noted Neuro: alert and oriented x4, no focal deficits Psych: Normal affect and mood  ASSESSMENT/PLAN:   Health care maintenance Declined covid vaccine Declined tdap vaccine Requested getting pap smear today after counseling about the recommended guidelines for q3 years with normal pap history. Due for mammogram next month and is scheduled. Mother with h/o breast cancer  Osteoarthritis of left knee Discontinued ibuprofen Continue mobic PRN and tylenol for pain Continue goal of weight loss  Cervical cancer screening Normal exam.  Pap collected today. Will contact with results     Richarda Osmond, [place]

## 2019-12-19 NOTE — Assessment & Plan Note (Signed)
Discontinued ibuprofen Continue mobic PRN and tylenol for pain Continue goal of weight loss

## 2019-12-21 ENCOUNTER — Ambulatory Visit: Payer: BC Managed Care – PPO | Admitting: Student in an Organized Health Care Education/Training Program

## 2019-12-21 LAB — CYTOLOGY - PAP
Comment: NEGATIVE
Diagnosis: NEGATIVE
Diagnosis: REACTIVE
High risk HPV: NEGATIVE

## 2020-01-18 ENCOUNTER — Other Ambulatory Visit: Payer: Self-pay

## 2020-01-18 ENCOUNTER — Ambulatory Visit
Admission: RE | Admit: 2020-01-18 | Discharge: 2020-01-18 | Disposition: A | Discharge status: Home / Self Care | Payer: BC Managed Care – PPO | Source: Ambulatory Visit | Attending: Family Medicine | Admitting: Family Medicine

## 2020-01-18 DIAGNOSIS — Z1231 Encounter for screening mammogram for malignant neoplasm of breast: Secondary | ICD-10-CM

## 2020-02-14 ENCOUNTER — Ambulatory Visit (INDEPENDENT_AMBULATORY_CARE_PROVIDER_SITE_OTHER): Payer: BC Managed Care – PPO | Admitting: Student in an Organized Health Care Education/Training Program

## 2020-02-14 ENCOUNTER — Other Ambulatory Visit: Payer: Self-pay

## 2020-02-14 DIAGNOSIS — M1712 Unilateral primary osteoarthritis, left knee: Secondary | ICD-10-CM | POA: Diagnosis not present

## 2020-02-14 NOTE — Progress Notes (Signed)
° °  SUBJECTIVE:   CHIEF COMPLAINT / HPI: FMLA paperwork  Chronic pain in left knee from degenerative changes: Requesting to continue current work restrictions.  40hrs per week 8hrs per day Sit as needed.  No other complaints today.  OBJECTIVE:   BP 125/80    Pulse 85    Ht 5\' 1"  (1.549 m)    Wt 216 lb (98 kg)    SpO2 99%    BMI 40.81 kg/m   General: NAD, pleasant, able to participate in exam Extremities: no edema or cyanosis. WWP. Left knee in brace. Algesic gait. Skin: warm and dry, no rashes noted Neuro: alert and oriented, no focal deficits Psych: Normal affect and mood  ASSESSMENT/PLAN:   Osteoarthritis of left knee Refilled meloxicam PRN Completed FMLA paperwork renewal and put in fax pile Discussed weight loss and exercise with patient as adjunct to knee pain management.  She declines knee injection or referral to ortho at this time.  Provided patient with handout for exercises to do while sitting and diet recommendations. Follow up 6 months     Angelica Campbell

## 2020-02-14 NOTE — Patient Instructions (Signed)
It was a pleasure to see you today!  To summarize our discussion for this visit:  It was good to see you today. I will get your paperwork back to you and your employer in the next few days  Weight loss can help with your knee pain so I've included some information here for diet changes and exercises you can do while sitting at work or during class.   Some additional health maintenance measures we should update are: Health Maintenance Due  Topic Date Due   Hepatitis C Screening  Never done   COVID-19 Vaccine (1) Never done   HIV Screening  Never done   TETANUS/TDAP  02/26/2013   INFLUENZA VACCINE  Never done      Please return to our clinic to see me in 6 months.  Call the clinic at [phone] if your symptoms worsen or you have any concerns.   Thank you for allowing me to take part in your care,  Dr. Doristine Mango   DASH Eating Plan DASH stands for "Dietary Approaches to Stop Hypertension." The DASH eating plan is a healthy eating plan that has been shown to reduce high blood pressure (hypertension). It may also reduce your risk for type 2 diabetes, heart disease, and stroke. The DASH eating plan may also help with weight loss. What are tips for following this plan?  General guidelines  Avoid eating more than 2,300 mg (milligrams) of salt (sodium) a day. If you have hypertension, you may need to reduce your sodium intake to 1,500 mg a day.  Limit alcohol intake to no more than 1 drink a day for nonpregnant women and 2 drinks a day for men. One drink equals 12 oz of beer, 5 oz of wine, or 1 oz of hard liquor.  Work with your health care provider to maintain a healthy body weight or to lose weight. Ask what an ideal weight is for you.  Get at least 30 minutes of exercise that causes your heart to beat faster (aerobic exercise) most days of the week. Activities may include walking, swimming, or biking.  Work with your health care provider or diet and nutrition  specialist (dietitian) to adjust your eating plan to your individual calorie needs. Reading food labels   Check food labels for the amount of sodium per serving. Choose foods with less than 5 percent of the Daily Value of sodium. Generally, foods with less than 300 mg of sodium per serving fit into this eating plan.  To find whole grains, look for the word "whole" as the first word in the ingredient list. Shopping  Buy products labeled as "low-sodium" or "no salt added."  Buy fresh foods. Avoid canned foods and premade or frozen meals. Cooking  Avoid adding salt when cooking. Use salt-free seasonings or herbs instead of table salt or sea salt. Check with your health care provider or pharmacist before using salt substitutes.  Do not fry foods. Cook foods using healthy methods such as baking, boiling, grilling, and broiling instead.  Cook with heart-healthy oils, such as olive, canola, soybean, or sunflower oil. Meal planning  Eat a balanced diet that includes: ? 5 or more servings of fruits and vegetables each day. At each meal, try to fill half of your plate with fruits and vegetables. ? Up to 6-8 servings of whole grains each day. ? Less than 6 oz of lean meat, poultry, or fish each day. A 3-oz serving of meat is about the same size as a deck  of cards. One egg equals 1 oz. ? 2 servings of low-fat dairy each day. ? A serving of nuts, seeds, or beans 5 times each week. ? Heart-healthy fats. Healthy fats called Omega-3 fatty acids are found in foods such as flaxseeds and coldwater fish, like sardines, salmon, and mackerel.  Limit how much you eat of the following: ? Canned or prepackaged foods. ? Food that is high in trans fat, such as fried foods. ? Food that is high in saturated fat, such as fatty meat. ? Sweets, desserts, sugary drinks, and other foods with added sugar. ? Full-fat dairy products.  Do not salt foods before eating.  Try to eat at least 2 vegetarian meals each  week.  Eat more home-cooked food and less restaurant, buffet, and fast food.  When eating at a restaurant, ask that your food be prepared with less salt or no salt, if possible. What foods are recommended? The items listed may not be a complete list. Talk with your dietitian about what dietary choices are best for you. Grains Whole-grain or whole-wheat bread. Whole-grain or whole-wheat pasta. Brown rice. [place]. Bulgur. Whole-grain and low-sodium cereals. Pita bread. Low-fat, low-sodium crackers. Whole-wheat flour tortillas. Vegetables Fresh or frozen vegetables (raw, steamed, roasted, or grilled). Low-sodium or reduced-sodium tomato and vegetable juice. Low-sodium or reduced-sodium tomato sauce and tomato paste. Low-sodium or reduced-sodium canned vegetables. Fruits All fresh, dried, or frozen fruit. Canned fruit in natural juice (without added sugar). Meat and other protein foods Skinless chicken or [place]. Ground chicken or [place]. Pork with fat trimmed off. Fish and seafood. Egg whites. Dried beans, peas, or lentils. Unsalted nuts, nut butters, and seeds. Unsalted canned beans. Lean cuts of beef with fat trimmed off. Low-sodium, lean deli meat. Dairy Low-fat (1%) or fat-free (skim) milk. Fat-free, low-fat, or reduced-fat cheeses. Nonfat, low-sodium ricotta or cottage cheese. Low-fat or nonfat yogurt. Low-fat, low-sodium cheese. Fats and oils Soft margarine without trans fats. Vegetable oil. Low-fat, reduced-fat, or light mayonnaise and salad dressings (reduced-sodium). Canola, safflower, olive, soybean, and sunflower oils. Avocado. Seasoning and other foods Herbs. Spices. Seasoning mixes without salt. Unsalted popcorn and pretzels. Fat-free sweets. What foods are not recommended? The items listed may not be a complete list. Talk with your dietitian about what dietary choices are best for you. Grains Baked goods made with fat, such as croissants, muffins, or some breads. Dry pasta  or rice meal packs. Vegetables Creamed or fried vegetables. Vegetables in a cheese sauce. Regular canned vegetables (not low-sodium or reduced-sodium). Regular canned tomato sauce and paste (not low-sodium or reduced-sodium). Regular tomato and vegetable juice (not low-sodium or reduced-sodium). Angie Fava. Olives. Fruits Canned fruit in a light or heavy syrup. Fried fruit. Fruit in cream or butter sauce. Meat and other protein foods Fatty cuts of meat. Ribs. Fried meat. Berniece Salines. Sausage. Bologna and other processed lunch meats. Salami. Fatback. Hotdogs. Bratwurst. Salted nuts and seeds. Canned beans with added salt. Canned or smoked fish. Whole eggs or egg yolks. Chicken or [place] with skin. Dairy Whole or 2% milk, cream, and half-and-half. Whole or full-fat cream cheese. Whole-fat or sweetened yogurt. Full-fat cheese. Nondairy creamers. Whipped toppings. Processed cheese and cheese spreads. Fats and oils Butter. Stick margarine. Lard. Shortening. Ghee. Bacon fat. Tropical oils, such as coconut, palm kernel, or palm oil. Seasoning and other foods Salted popcorn and pretzels. Onion salt, garlic salt, seasoned salt, table salt, and sea salt. Worcestershire sauce. Tartar sauce. Barbecue sauce. Teriyaki sauce. Soy sauce, including reduced-sodium. Steak sauce. Canned and  packaged gravies. Fish sauce. Oyster sauce. Cocktail sauce. Horseradish that you find on the shelf. Ketchup. Mustard. Meat flavorings and tenderizers. Bouillon cubes. Hot sauce and Tabasco sauce. Premade or packaged marinades. Premade or packaged taco seasonings. Relishes. Regular salad dressings. Where to find more information:  National Heart, Lung, and [place]: https://wilson-eaton.com/  American Heart Association: www.heart.org Summary  The DASH eating plan is a healthy eating plan that has been shown to reduce high blood pressure (hypertension). It may also reduce your risk for type 2 diabetes, heart disease, and stroke.  With the  DASH eating plan, you should limit salt (sodium) intake to 2,300 mg a day. If you have hypertension, you may need to reduce your sodium intake to 1,500 mg a day.  When on the DASH eating plan, aim to eat more fresh fruits and vegetables, whole grains, lean proteins, low-fat dairy, and heart-healthy fats.  Work with your health care provider or diet and nutrition specialist (dietitian) to adjust your eating plan to your individual calorie needs. This information is not intended to replace advice given to you by your health care provider. Make sure you discuss any questions you have with your health care provider. Document Revised: 04/24/2017 Document Reviewed: 05/05/2016 Elsevier Patient Education  [place].   Exercises To Do While Sitting  Exercises that you do while sitting (chair exercises) can give you many of the same benefits as full exercise. Benefits include strengthening your heart, burning calories, and keeping muscles and joints healthy. Exercise can also improve your mood and help with depression and anxiety. You may benefit from chair exercises if you are unable to do standing exercises because of:  Diabetic foot pain.  Obesity.  Illness.  Arthritis.  Recovery from surgery or injury.  Breathing problems.  Balance problems.  Another type of disability. Before starting chair exercises, check with your health care provider or a physical therapist to find out how much exercise you can tolerate and which exercises are safe for you. If your health care provider approves:  Start out slowly and build up over time. Aim to work up to about 10-20 minutes for each exercise session.  Make exercise part of your daily routine.  Drink water when you exercise. Do not wait until you are thirsty. Drink every 10-15 minutes.  Stop exercising right away if you have pain, nausea, shortness of breath, or dizziness.  If you are exercising in a wheelchair, make sure to lock the  wheels.  Ask your health care provider whether you can do tai chi or yoga. Many positions in these mind-body exercises can be modified to do while seated. Warm-up Before starting other exercises: 1. Sit up as straight as you can. Have your knees bent at 90 degrees, which is the shape of the capital letter "L." Keep your feet flat on the floor. 2. Sit at the front edge of your chair, if you can. 3. Pull in (tighten) the muscles in your abdomen and stretch your spine and neck as straight as you can. Hold this position for a few minutes. 4. Breathe in and out evenly. Try to concentrate on your breathing, and relax your mind. Stretching Exercise A: Arm stretch 1. Hold your arms out straight in front of your body. 2. Bend your hands at the wrist with your fingers pointing up, as if signaling someone to stop. Notice the slight tension in your forearms as you hold the position. 3. Keeping your arms out and your hands bent, rotate your  hands outward as far as you can and hold this stretch. Aim to have your thumbs pointing up and your pinkie fingers pointing down. Slowly repeat arm stretches for one minute as tolerated. Exercise B: Leg stretch 1. If you can move your legs, try to "draw" letters on the floor with the toes of your foot. Write your name with one foot. 2. Write your name with the toes of your other foot. Slowly repeat the movements for one minute as tolerated. Exercise C: Reach for the sky 1. Reach your hands as far over your head as you can to stretch your spine. 2. Move your hands and arms as if you are climbing a rope. Slowly repeat the movements for one minute as tolerated. Range of motion exercises Exercise A: Shoulder roll 1. Let your arms hang loosely at your sides. 2. Lift just your shoulders up toward your ears, then let them relax back down. 3. When your shoulders feel loose, rotate your shoulders in backward and forward circles. Do shoulder rolls slowly for one minute as  tolerated. Exercise B: March in place 1. As if you are marching, pump your arms and lift your legs up and down. Lift your knees as high as you can. ? If you are unable to lift your knees, just pump your arms and move your ankles and feet up and down. March in place for one minute as tolerated. Exercise C: Seated jumping jacks 1. Let your arms hang down straight. 2. Keeping your arms straight, lift them up over your head. Aim to point your fingers to the ceiling. 3. While you lift your arms, straighten your legs and slide your heels along the floor to your sides, as wide as you can. 4. As you bring your arms back down to your sides, slide your legs back together. ? If you are unable to use your legs, just move your arms. Slowly repeat seated jumping jacks for one minute as tolerated. Strengthening exercises Exercise A: Shoulder squeeze 1. Hold your arms straight out from your body to your sides, with your elbows bent and your fists pointed at the ceiling. 2. Keeping your arms in the bent position, move them forward so your elbows and forearms meet in front of your face. 3. Open your arms back out as wide as you can with your elbows still bent, until you feel your shoulder blades squeezing together. Hold for 5 seconds. Slowly repeat the movements forward and backward for one minute as tolerated. Contact a health care provider if you:  Had to stop exercising due to any of the following: ? Pain. ? Nausea. ? Shortness of breath. ? Dizziness. ? Fatigue.  Have significant pain or soreness after exercising. Get help right away if you have:  Chest pain.  Difficulty breathing. These symptoms may represent a serious problem that is an emergency. Do not wait to see if the symptoms will go away. Get medical help right away. Call your local emergency services (911 in the [place] Do not drive yourself to the hospital. This information is not intended to replace advice given to you by your health  care provider. Make sure you discuss any questions you have with your health care provider. Document Revised: 09/02/2018 Document Reviewed: 03/25/2017 Elsevier Patient Education  2020 [employer].

## 2020-02-15 ENCOUNTER — Telehealth: Payer: Self-pay | Admitting: Student in an Organized Health Care Education/Training Program

## 2020-02-16 NOTE — Assessment & Plan Note (Addendum)
Refilled meloxicam PRN Completed FMLA paperwork renewal and put in fax pile Discussed weight loss and exercise with patient as adjunct to knee pain management.  She declines knee injection or referral to ortho at this time.  Provided patient with handout for exercises to do while sitting and diet recommendations. Follow up 6 months

## 2020-02-18 ENCOUNTER — Encounter: Payer: Self-pay | Admitting: Student in an Organized Health Care Education/Training Program

## 2020-02-22 ENCOUNTER — Encounter: Payer: Self-pay | Admitting: Student in an Organized Health Care Education/Training Program

## 2020-02-24 ENCOUNTER — Telehealth: Payer: Self-pay | Admitting: Student in an Organized Health Care Education/Training Program

## 2020-02-24 NOTE — Telephone Encounter (Signed)
FMLA form dropped off for at front desk for completion.  Verified that patient section of form has been completed.  Last DOS/WCC with PCP was 02/14/20.  Placed form in red team folder to be completed by clinical staff.  Creig Hines

## 2020-02-27 NOTE — Telephone Encounter (Signed)
Reviewed form and placed in PCP's box for completion.  .Dorinne Graeff R Burnis Kaser, CMA  

## 2020-03-06 ENCOUNTER — Encounter: Payer: Self-pay | Admitting: Student in an Organized Health Care Education/Training Program

## 2020-03-06 NOTE — Telephone Encounter (Signed)
Patient calling to check status of this FMLA paperwork. Forms not in PCP's box so assuming she has them already. Patient wants to be sure these get completed correctly this time since the first time they were not. She says they are due by October 18th and she would like them faxed to the number on the form and she would also like to pick up a copy of it.   Patient also says she will be sending a MyChart message to PCP regarding this.   Please advise.

## 2020-03-07 ENCOUNTER — Telehealth: Payer: Self-pay | Admitting: Student in an Organized Health Care Education/Training Program

## 2020-03-07 NOTE — Telephone Encounter (Signed)
Pt calling again to check status of her FMLA paperwork. She needs papers completed before Monday 03/12/20! Please call her @ [phone] before 2:30 or home after 3:00pm [phone] when forms are completed please.

## 2020-03-07 NOTE — Telephone Encounter (Signed)
Opened in error

## 2020-03-11 ENCOUNTER — Other Ambulatory Visit: Payer: Self-pay | Admitting: Family Medicine

## 2020-04-04 ENCOUNTER — Other Ambulatory Visit: Payer: Self-pay | Admitting: Student in an Organized Health Care Education/Training Program

## 2020-05-03 ENCOUNTER — Other Ambulatory Visit: Payer: Self-pay | Admitting: Student in an Organized Health Care Education/Training Program

## 2020-05-30 ENCOUNTER — Other Ambulatory Visit: Payer: Self-pay | Admitting: Student in an Organized Health Care Education/Training Program

## 2020-07-18 ENCOUNTER — Other Ambulatory Visit: Payer: Self-pay | Admitting: Student in an Organized Health Care Education/Training Program

## 2020-08-14 ENCOUNTER — Ambulatory Visit: Payer: BC Managed Care – PPO | Admitting: Student in an Organized Health Care Education/Training Program

## 2020-08-17 ENCOUNTER — Encounter: Payer: Self-pay | Admitting: Student in an Organized Health Care Education/Training Program

## 2020-08-17 ENCOUNTER — Other Ambulatory Visit: Payer: Self-pay

## 2020-08-17 ENCOUNTER — Ambulatory Visit (INDEPENDENT_AMBULATORY_CARE_PROVIDER_SITE_OTHER): Payer: BC Managed Care – PPO | Admitting: Student in an Organized Health Care Education/Training Program

## 2020-08-17 VITALS — BP 146/88 | HR 79 | Wt 214.4 lb

## 2020-08-17 DIAGNOSIS — M1712 Unilateral primary osteoarthritis, left knee: Secondary | ICD-10-CM

## 2020-08-17 NOTE — Patient Instructions (Signed)
It was a pleasure to see you today!  To summarize our discussion for this visit:  I will fill out your FMLA paperwork and have it faxed to your workplace plus leave a copy up front for you to pick up.   Sending in a referral for physical therapy. They should contact you in the next couple weeks. Let me know if you do not hear from them  Some additional health maintenance measures we should update are: Health Maintenance Due  Topic Date Due   Hepatitis C Screening  Never done   COVID-19 Vaccine (1) Never done   HIV Screening  Never done   TETANUS/TDAP  02/26/2013   COLONOSCOPY (Pts 45-20yrs Insurance coverage will need to be confirmed)  Never done   INFLUENZA VACCINE  Never done      Please return to our clinic to see me for a physical in the next month or so.  Call the clinic at [phone] if your symptoms worsen or you have any concerns.   Thank you for allowing me to take part in your care,  Dr. Doristine Mango

## 2020-08-17 NOTE — Progress Notes (Signed)
   SUBJECTIVE:   CHIEF COMPLAINT / HPI: FMLA paperwork  L knee pain, chronic-demonstrated osteoarthritis in the past on x-ray.  Patient here today to renew her FMLA paperwork.  States that previous restrictions of 8-hour workday and 40-hour week with option to take sitting breaks was sufficient for her.  She only requests increase in the optional days to call out of work when she has flareups to be 2-3 times as needed.  She does use a cane.  OBJECTIVE:   BP (!) 146/88   Pulse 79   Wt 214 lb 6.4 oz (97.3 kg)   SpO2 99%   BMI 40.51 kg/m   Physical Exam Vitals and nursing note reviewed.  Constitutional:      General: She is not in acute distress.    Appearance: She is obese. She is not toxic-appearing.  Musculoskeletal:     Right lower leg: No tenderness. No edema.     Left lower leg: Tenderness (Tenderness to palpation of bilateral joint line, patellar tendon, patellar grind, quadricep tendon.  Negative for effusion.  Negative McMurray's and drawer testing) present. No edema.     Comments: Gait with slight limp favoring right leg  Neurological:     Mental Status: She is alert.    ASSESSMENT/PLAN:   Osteoarthritis of left knee Patient dropped off FMLA paperwork and request renewal be faxed to her work and also copy left for her at the front desk Patient is amenable to physical therapy at this time so referral was sent Also discussed options of knee injections which patient wants to hold off on for as long as possible   Asked patient to return for annual physical at earliest [place], [place]

## 2020-08-18 NOTE — Assessment & Plan Note (Signed)
Patient dropped off FMLA paperwork and request renewal be faxed to her work and also copy left for her at the front desk Patient is amenable to physical therapy at this time so referral was sent Also discussed options of knee injections which patient wants to hold off on for as long as possible

## 2020-08-21 ENCOUNTER — Telehealth: Payer: Self-pay | Admitting: Student in an Organized Health Care Education/Training Program

## 2020-08-21 ENCOUNTER — Other Ambulatory Visit: Payer: Self-pay | Admitting: Student in an Organized Health Care Education/Training Program

## 2020-08-21 NOTE — Telephone Encounter (Signed)
Faxed patients FMLA paperwork. Called patient to let her know this had been done and that the original copy was left at the front desk.

## 2020-09-14 ENCOUNTER — Other Ambulatory Visit: Payer: Self-pay

## 2020-09-14 ENCOUNTER — Ambulatory Visit: Payer: BC Managed Care – PPO | Attending: Family Medicine

## 2020-09-14 DIAGNOSIS — M25662 Stiffness of left knee, not elsewhere classified: Secondary | ICD-10-CM | POA: Diagnosis present

## 2020-09-14 DIAGNOSIS — M25562 Pain in left knee: Secondary | ICD-10-CM | POA: Insufficient documentation

## 2020-09-14 DIAGNOSIS — M1712 Unilateral primary osteoarthritis, left knee: Secondary | ICD-10-CM | POA: Insufficient documentation

## 2020-09-14 DIAGNOSIS — M6281 Muscle weakness (generalized): Secondary | ICD-10-CM | POA: Insufficient documentation

## 2020-09-14 DIAGNOSIS — G8929 Other chronic pain: Secondary | ICD-10-CM | POA: Diagnosis present

## 2020-09-15 NOTE — Therapy (Signed)
[place], [place], [address] Phone: [phone]   Fax:  [phone]  Physical Therapy Evaluation  Patient Details  Name: Angelica Campbell MRN: [id number] Date of Birth: 12-Jan-1971 Referring Provider (PT): McDiarmid, Blane Ohara, MD   Encounter Date: 09/14/2020   PT End of Session - 09/15/20 2233    Visit Number 1    Number of Visits 9    Date for PT Re-Evaluation 11/24/20    Authorization Type BCBS COMM PPO    Authorization Time Period Reassess FOTO on the 5th and 10th visits    Progress Note Due on Visit 10    PT Start Time 1300    PT Stop Time 1350    PT Time Calculation (min) 50 min    Activity Tolerance Patient tolerated treatment well    Behavior During Therapy [facility] for tasks assessed/performed           History reviewed. No pertinent past medical history.  History reviewed. No pertinent surgical history.  There were no vitals filed for this visit.    Subjective Assessment - 09/15/20 2336    Subjective L knee pain has been an issue for approx 2 years. It does not hurt everyday, but prolonged standing normally aggrevates it. 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    Pertinent History L tibial fracture in her 39s    Limitations Lifting;House hold activities    How long can you sit comfortably? no issue    How long can you stand comfortably? depends, 30 mins- 51mins    How long can you walk comfortably? less of an issue, does not walk for exs    Diagnostic tests IMPRESSION:  1. No acute osseous abnormality  2. Tricompartment arthritis of the knee with trace knee effusion  3. Possible chondrocalcinosis  4. Possible old fracture deformities of the lateral tibial plateau  and fibular head/neck.    Patient Stated Goals Less pain and lose weight    Currently in Pain? Yes    Pain Score 5    5-7/10   Pain Location Knee     Pain Orientation Left    Pain Descriptors / Indicators Throbbing;Sharp    Pain Type Chronic pain    Pain Onset More than a month ago    Pain Frequency Intermittent    Aggravating Factors  Prolonged standing    Pain Relieving Factors meds, stretching, knee brace, rest    Effect of Pain on Daily Activities I do what I need to do to manage the pain and be active.              [facility] PT Assessment - 09/15/20 0001      Assessment   Medical Diagnosis Primary osteoarthritis of left knee    Referring Provider (PT) McDiarmid, Blane Ohara, MD    Onset Date/Surgical Date --   a couple of years   Hand Dominance Right    Prior Therapy no      Precautions   Precautions None      Restrictions   Weight Bearing Restrictions No      Balance Screen   Has the patient fallen in the past 6 months No      [place] residence    Living Arrangements Children    Type of Colony to  enter    Entrance Stairs-Number of Steps 2    Entrance Stairs-Rails Right    Home Layout Two level    Alternate Level Stairs-Number of Steps 13      Prior Function   Level of Independence Independent    Vocation Full time employment    Vocation Requirements Assembly for gas pumps   Standing, but have option to sit down. Functions better with an 8 hr day     Cognition   Overall Cognitive Status Within Functional Limits for tasks assessed      Observation/Other Assessments   Observations Valgus of both knees    Focus on Therapeutic Outcomes (FOTO)  53% ability      Sensation   Light Touch Appears Intact      ROM / Strength   AROM / PROM / Strength AROM;Strength      AROM   AROM Assessment Site Knee    Right/Left Knee Right;Left    Right Knee Extension 0    Right Knee Flexion 115    Left Knee Extension 0    Left Knee Flexion 110      Strength   Strength Assessment Site Knee;Hip    Right/Left Hip Right;Left    Right Hip Flexion 4+/5    Right  Hip Extension 4/5    Right Hip External Rotation  4+/5    Right Hip Internal Rotation 4/5    Right Hip ABduction 4/5    Right Hip ADduction 4/5    Left Hip Flexion 4/5    Left Hip Extension 3+/5    Left Hip External Rotation 3+/5    Left Hip Internal Rotation 3+/5    Left Hip ABduction 4/5    Left Hip ADduction 3+/5    Right/Left Knee Right;Left    Right Knee Flexion 5/5    Right Knee Extension 5/5    Left Knee Flexion 4+/5    Left Knee Extension 4+/5      Palpation   Patella mobility decreased for all motions    Palpation comment TTP of the medial and lateral knee joint space, medial>lateral      Special Tests    Special Tests Knee Special Tests    Knee Special tests  Patellofemoral Apprehension Test;Patellofemoral Grind Test (Clarke's Sign)      Patellofemoral Apprehension Test    Findings Positive    Side  Left      Patellofemoral Grind test (Clark's Sign)   Findings Postive    Side  Left      Transfers   Transfers Sit to Stand;Stand to Sit    Sit to Stand 7: Independent      Ambulation/Gait   Ambulation/Gait Yes    Ambulation/Gait Assistance 7: Independent    Gait Pattern Step-through pattern                      Objective measurements completed on examination: See above findings.               PT Education - 09/15/20 2229    Education Details Eval findings, POC, HEP, RICE for symptom management. Discussed the possiblity of aquatics for rehab.    Person(s) Educated Patient    Methods Explanation;Demonstration;Tactile cues;Verbal cues;Handout    Comprehension Verbalized understanding;Returned demonstration;Verbal cues required;Tactile cues required            PT Short Term Goals - 09/15/20 2319      PT SHORT TERM GOAL #1   Title Pt  will be Ind in an initial HEP    Baseline started on eval    Status New    Target Date 10/06/20      PT SHORT TERM GOAL #2   Title Pt will voice understanding of measures to assist in the reduction  and maintainance of L knee pain    Status New    Target Date 10/06/20             PT Long Term Goals - 09/15/20 2323      PT LONG TERM GOAL #1   Title Increase the mobility of the L patella to assist with decreasing pain and improve function    Status New    Target Date 11/17/20      PT LONG TERM GOAL #2   Title Increase  L knee strength to 5/5 and L hip to 4+/5 to assist with decreasing pain and improving function    Status New    Target Date 11/17/20      PT LONG TERM GOAL #3   Title Pt wil report a decrease in pain range at work to 3-5/10    Baseline 5-7/10    Status New    Target Date 11/17/20      PT LONG TERM GOAL #4   Title Pt's FOTO score will improve to 67% ability    Baseline 53% ability    Status New    Target Date 11/17/20      PT LONG TERM GOAL #5   Title Pt will be Ind in a final HEP to maintain or progress achieved LOF    Status New    Target Date 11/17/20                  Plan - 09/15/20 2237    Clinical Impression Statement Pt presents with chronic pain of the L knee. Pt is tender to the joint spaces, medial > than lateral, patellar mobility is decreases, patellar apprehension and Clark's sign is positive, and the hip and knee are weak. Pt will benefit from PT 1w8 for L LE strengthening, patellar mobility, manual techniques, and modalities as indicated to decrease pain and optimize function of the L knee/LE.    Personal Factors and Comorbidities Profession;Past/Current Experience;Fitness;Comorbidity 1;Time since onset of injury/illness/exacerbation    Comorbidities obesity    Examination-Activity Limitations Squat;Stand    Examination-Participation Restrictions Occupation    Stability/Clinical Decision Making Stable/Uncomplicated    Clinical Decision Making Low    Rehab Potential Good    PT Frequency 1x / week    PT Duration 8 weeks    PT Treatment/Interventions ADLs/Self Care Home Management;Cryotherapy;Electrical Stimulation;Iontophoresis  4mg /ml Dexamethasone;Moist Heat;Ultrasound;Therapeutic exercise;Therapeutic activities;Patient/family education;Manual techniques;Passive range of motion;Dry needling;Taping;Vasopneumatic Device;Joint Manipulations;Aquatic Therapy    PT Next Visit Plan Assess response to HEP. Review FOTO    PT Home Exercise Plan [id number]    Consulted and Agree with Plan of Care Patient           Patient will benefit from skilled therapeutic intervention in order to improve the following deficits and impairments:  Decreased strength,Pain,Decreased activity tolerance,Obesity,Decreased range of motion  Visit Diagnosis: Primary osteoarthritis of one knee, left - Plan: PT plan of care cert/re-cert  Decreased ROM of left knee - Plan: PT plan of care cert/re-cert  Muscle weakness (generalized) - Plan: PT plan of care cert/re-cert  Chronic pain of left knee - Plan: PT plan of care cert/re-cert     Problem List Patient Active Problem List  Diagnosis Date Noted  . Cervical cancer screening 12/19/2019  . Osteoarthritis of left knee 12/23/2018  . Health care maintenance 08/09/2014  . OBESITY, MODERATE 02/13/2009  . ANXIETY 07/23/2006  . TOBACCO DEPENDENCE 07/23/2006    Gar Ponto MS, PT 09/15/20 11:37 PM  [facility] [address] [place], [place], [address] Phone: [phone]   Fax:  [phone]  Name: Angelica Campbell MRN: [id number] Date of Birth: Nov 27, 1970

## 2020-09-17 ENCOUNTER — Other Ambulatory Visit: Payer: Self-pay

## 2020-09-17 ENCOUNTER — Ambulatory Visit: Payer: BC Managed Care – PPO

## 2020-09-17 DIAGNOSIS — M25562 Pain in left knee: Secondary | ICD-10-CM

## 2020-09-17 DIAGNOSIS — G8929 Other chronic pain: Secondary | ICD-10-CM

## 2020-09-17 DIAGNOSIS — M1712 Unilateral primary osteoarthritis, left knee: Secondary | ICD-10-CM

## 2020-09-17 DIAGNOSIS — M6281 Muscle weakness (generalized): Secondary | ICD-10-CM

## 2020-09-17 DIAGNOSIS — M25662 Stiffness of left knee, not elsewhere classified: Secondary | ICD-10-CM

## 2020-09-17 NOTE — Therapy (Signed)
[place], [place], [address] Phone: [phone]   Fax:  [phone]  Physical Therapy Treatment  Patient Details  Name: Angelica Campbell MRN: [id number] Date of Birth: 1970/12/16 Referring Provider (PT): McDiarmid, Blane Ohara, MD   Encounter Date: 09/17/2020   PT End of Session - 09/17/20 1621    Visit Number 2    Number of Visits 9    Date for PT Re-Evaluation 11/24/20    Authorization Type BCBS COMM PPO    Authorization Time Period Reassess FOTO on the 5th and 10th visits    Progress Note Due on Visit 10    PT Start Time 1619    PT Stop Time 1658    PT Time Calculation (min) 39 min    Activity Tolerance Patient tolerated treatment well    Behavior During Therapy [facility] for tasks assessed/performed           No past medical history on file.  No past surgical history on file.  There were no vitals filed for this visit.   Subjective Assessment - 09/17/20 1621    Subjective Pt presents to PT with continued reports of L knee pain and discomfort. She states she has not yet had a chance to start HEP yet as she was evaluated two days prior. Pt is ready to begin PT treatment at this time.    Currently in Pain? Yes    Pain Score 5     Pain Location Knee    Pain Orientation Left                             [facility] Adult PT Treatment/Exercise - 09/17/20 0001      Exercises   Exercises Knee/Hip      Knee/Hip Exercises: Aerobic   Nustep lvl 5 x 4 min UE/LE while taking subjective      Knee/Hip Exercises: Standing   Hip Abduction 2 sets;10 reps;Both    Hip Extension 2 sets;10 reps;Both    Other Standing Knee Exercises mini squat at counter 2x10      Knee/Hip Exercises: Seated   Long Arc Quad 2 sets;10 reps;Both;Weights    Long Arc Quad Weight 2 lbs.    Sit to Sand 2 sets;10 reps;without UE support      Knee/Hip Exercises: Supine   Bridges 2 sets;10 reps      Knee/Hip Exercises:  Sidelying   Hip ABduction 2 sets;10 reps;Both    Clams 2x10                  PT Education - 09/17/20 1727    Education Details HEP    Person(s) Educated Patient    Methods Explanation;Demonstration;Handout    Comprehension Verbalized understanding;Returned demonstration            PT Short Term Goals - 09/15/20 2319      PT SHORT TERM GOAL #1   Title Pt will be Ind in an initial HEP    Baseline started on eval    Status New    Target Date 10/06/20      PT SHORT TERM GOAL #2   Title Pt will voice understanding of measures to assist in the reduction and maintainance of L knee pain    Status New    Target Date 10/06/20             PT Long Term Goals - 09/15/20 2323  PT LONG TERM GOAL #1   Title Increase the mobility of the L patella to assist with decreasing pain and improve function    Status New    Target Date 11/17/20      PT LONG TERM GOAL #2   Title Increase  L knee strength to 5/5 and L hip to 4+/5 to assist with decreasing pain and improving function    Status New    Target Date 11/17/20      PT LONG TERM GOAL #3   Title Pt wil report a decrease in pain range at work to 3-5/10    Baseline 5-7/10    Status New    Target Date 11/17/20      PT LONG TERM GOAL #4   Title Pt's FOTO score will improve to 67% ability    Baseline 53% ability    Status New    Target Date 11/17/20      PT LONG TERM GOAL #5   Title Pt will be Ind in a final HEP to maintain or progress achieved LOF    Status New    Target Date 11/17/20                 Plan - 09/17/20 1728    Clinical Impression Statement Pt was able to complete all prescribed exercises with no adverse effect or increase in pain. She was able to progress to standing strengthening exercises which were added to HEP. PT will continue to progress exercises as tolerated per POC as prescribed.    PT Treatment/Interventions ADLs/Self Care Home Management;Cryotherapy;Electrical  Stimulation;Iontophoresis 4mg /ml Dexamethasone;Moist Heat;Ultrasound;Therapeutic exercise;Therapeutic activities;Patient/family education;Manual techniques;Passive range of motion;Dry needling;Taping;Vasopneumatic Device;Joint Manipulations;Aquatic Therapy    PT Next Visit Plan progress exercises as able    PT Home Exercise Plan [id number]           Patient will benefit from skilled therapeutic intervention in order to improve the following deficits and impairments:  Decreased strength,Pain,Decreased activity tolerance,Obesity,Decreased range of motion  Visit Diagnosis: Primary osteoarthritis of one knee, left  Decreased ROM of left knee  Muscle weakness (generalized)  Chronic pain of left knee     Problem List Patient Active Problem List   Diagnosis Date Noted  . Cervical cancer screening 12/19/2019  . Osteoarthritis of left knee 12/23/2018  . Health care maintenance 08/09/2014  . OBESITY, MODERATE 02/13/2009  . ANXIETY 07/23/2006  . TOBACCO DEPENDENCE 07/23/2006    Ward Chatters, PT, DPT 09/17/20 5:31 PM  [facility] [address] [place], [place], [address] Phone: [phone]   Fax:  [phone]  Name: Angelica Campbell MRN: [id number] Date of Birth: 08/02/1970

## 2020-09-22 ENCOUNTER — Other Ambulatory Visit: Payer: Self-pay | Admitting: Student in an Organized Health Care Education/Training Program

## 2020-09-25 ENCOUNTER — Ambulatory Visit: Payer: BC Managed Care – PPO | Attending: Family Medicine | Admitting: Physical Therapy

## 2020-09-25 ENCOUNTER — Other Ambulatory Visit: Payer: Self-pay

## 2020-09-25 DIAGNOSIS — G8929 Other chronic pain: Secondary | ICD-10-CM | POA: Diagnosis present

## 2020-09-25 DIAGNOSIS — M6281 Muscle weakness (generalized): Secondary | ICD-10-CM | POA: Diagnosis present

## 2020-09-25 DIAGNOSIS — M1712 Unilateral primary osteoarthritis, left knee: Secondary | ICD-10-CM | POA: Insufficient documentation

## 2020-09-25 DIAGNOSIS — M25662 Stiffness of left knee, not elsewhere classified: Secondary | ICD-10-CM | POA: Diagnosis present

## 2020-09-25 DIAGNOSIS — M25562 Pain in left knee: Secondary | ICD-10-CM | POA: Diagnosis present

## 2020-09-25 NOTE — Therapy (Signed)
[place], [place], [address] Phone: [phone]   Fax:  [phone]  Physical Therapy Evaluation  Patient Details  Name: Angelica Campbell MRN: [id number] Date of Birth: Aug 15, 1970 Referring Provider (PT): McDiarmid, Blane Ohara, MD   Encounter Date: 09/25/2020   PT End of Session - 09/25/20 1621    Visit Number 3    Number of Visits 9    Date for PT Re-Evaluation 11/24/20    Authorization Type BCBS COMM PPO    Authorization Time Period Reassess FOTO on the 5th and 10th visits    Progress Note Due on Visit 10    PT Start Time 1621    PT Stop Time 1700    PT Time Calculation (min) 39 min    Activity Tolerance Patient tolerated treatment well    Behavior During Therapy [facility] for tasks assessed/performed           No past medical history on file.  No past surgical history on file.  There were no vitals filed for this visit.    Subjective Assessment - 09/25/20 1624    Subjective Pt reports that she has been chasing her son's dogs around today.  She reports this has irritated her knee pain.  She reports that her knee pain is improving overall.  She has been compliant with HEP.    Pain Score 6     Pain Location Knee    Pain Orientation Left                          Objective measurements completed on examination: See above findings.       [facility] Adult PT Treatment/Exercise - 09/25/20 0001      Knee/Hip Exercises: Aerobic   Nustep lvl 5 x 6 min UE/LE while taking subjective      Knee/Hip Exercises: Standing   Hip Abduction 10 reps;Both;2 sets    Hip Extension 10 reps;Both;2 sets    Other Standing Knee Exercises --      Knee/Hip Exercises: Seated   Long Arc Quad 3 sets;10 reps;Both;Weights    Long Arc Quad Weight 3 lbs.    Sit to Sand 2 sets;10 reps;without UE support   with hip ER red TB around knees     Knee/Hip Exercises: Supine   Bridges --   2x12 with hip ER cue - red TB    Bridges Limitations 2x12 with ER cue (RTB)    Straight Leg Raises Strengthening;2 sets;10 reps      Knee/Hip Exercises: Sidelying   Hip ABduction 2 sets;10 reps;Both    Clams 2x10 - RTB                  PT Education - 09/25/20 1627    Education Details updated HEP to include SLR    Person(s) Educated Patient    Methods Explanation    Comprehension Verbalized understanding            PT Short Term Goals - 09/15/20 2319      PT SHORT TERM GOAL #1   Title Pt will be Ind in an initial HEP    Baseline started on eval    Status New    Target Date 10/06/20      PT SHORT TERM GOAL #2   Title Pt will voice understanding of measures to assist in the reduction and maintainance of L knee pain    Status New  Target Date 10/06/20             PT Long Term Goals - 09/15/20 2323      PT LONG TERM GOAL #1   Title Increase the mobility of the L patella to assist with decreasing pain and improve function    Status New    Target Date 11/17/20      PT LONG TERM GOAL #2   Title Increase  L knee strength to 5/5 and L hip to 4+/5 to assist with decreasing pain and improving function    Status New    Target Date 11/17/20      PT LONG TERM GOAL #3   Title Pt wil report a decrease in pain range at work to 3-5/10    Baseline 5-7/10    Status New    Target Date 11/17/20      PT LONG TERM GOAL #4   Title Pt's FOTO score will improve to 67% ability    Baseline 53% ability    Status New    Target Date 11/17/20      PT LONG TERM GOAL #5   Title Pt will be Ind in a final HEP to maintain or progress achieved LOF    Status New    Target Date 11/17/20                  Plan - 09/25/20 1635    Clinical Impression Statement Pt tolerates session well.  Displays significant fatigue with all therex but no increase in L knee sxs.  Significant quad weakness and lateral hip weakness bilaterally L>R.  Significant knee valgus bil.  Significant crepetius w/o pain with LAQ.    PT  Treatment/Interventions ADLs/Self Care Home Management;Cryotherapy;Electrical Stimulation;Iontophoresis 4mg /ml Dexamethasone;Moist Heat;Ultrasound;Therapeutic exercise;Therapeutic activities;Patient/family education;Manual techniques;Passive range of motion;Dry needling;Taping;Vasopneumatic Device;Joint Manipulations;Aquatic Therapy    PT Next Visit Plan progress exercises as able    PT Home Exercise Plan [id number]           Patient will benefit from skilled therapeutic intervention in order to improve the following deficits and impairments:  Decreased strength,Pain,Decreased activity tolerance,Obesity,Decreased range of motion  Visit Diagnosis: Primary osteoarthritis of one knee, left  Decreased ROM of left knee  Muscle weakness (generalized)  Chronic pain of left knee   Problem List Patient Active Problem List   Diagnosis Date Noted  . Cervical cancer screening 12/19/2019  . Osteoarthritis of left knee 12/23/2018  . Health care maintenance 08/09/2014  . OBESITY, MODERATE 02/13/2009  . ANXIETY 07/23/2006  . TOBACCO DEPENDENCE 07/23/2006    Shearon Balo PT, DPT 09/25/20 5:07 PM  [place] [facility] [address] [place], [place], [address] Phone: [phone]   Fax:  [phone]  Name: [name] MRN: [id number] Date of Birth: 17-Oct-1970

## 2020-10-02 ENCOUNTER — Ambulatory Visit: Payer: BC Managed Care – PPO | Admitting: Physical Therapy

## 2020-10-02 ENCOUNTER — Encounter: Payer: Self-pay | Admitting: Physical Therapy

## 2020-10-02 ENCOUNTER — Telehealth: Payer: Self-pay | Admitting: Physical Therapy

## 2020-10-02 ENCOUNTER — Other Ambulatory Visit: Payer: Self-pay

## 2020-10-02 DIAGNOSIS — M1712 Unilateral primary osteoarthritis, left knee: Secondary | ICD-10-CM

## 2020-10-02 DIAGNOSIS — M6281 Muscle weakness (generalized): Secondary | ICD-10-CM

## 2020-10-02 DIAGNOSIS — M25662 Stiffness of left knee, not elsewhere classified: Secondary | ICD-10-CM

## 2020-10-02 DIAGNOSIS — G8929 Other chronic pain: Secondary | ICD-10-CM

## 2020-10-02 NOTE — Therapy (Signed)
[place], [place], [address] Phone: [phone]   Fax:  [phone]  Physical Therapy Treatment  Patient Details  Name: Angelica Campbell MRN: [id number] Date of Birth: 06/17/1970 Referring Provider (PT): McDiarmid, Blane Ohara, MD   Encounter Date: 10/02/2020   PT End of Session - 10/02/20 1615    Visit Number 4    Number of Visits 9    Date for PT Re-Evaluation 11/24/20    Authorization Type BCBS COMM PPO    Authorization Time Period Reassess FOTO on the 5th and 10th visits    Progress Note Due on Visit 10    PT Start Time 1615    PT Stop Time 1656    PT Time Calculation (min) 41 min    Activity Tolerance Patient tolerated treatment well    Behavior During Therapy [facility] for tasks assessed/performed           History reviewed. No pertinent past medical history.  History reviewed. No pertinent surgical history.  There were no vitals filed for this visit.   Subjective Assessment - 10/02/20 1615    Subjective Pt reports that her knees are feeling about the same.  She does feel like she can go up and down stairs wil less pain now.  Sometimes they hurt more than others.  She has been HEP compliant. She reports her HEP is still challenging.  She was tired after last visit "I was worn out".    Currently in Pain? Yes    Pain Score 6     Pain Location Knee    Pain Orientation Left                             [facility] Adult PT Treatment/Exercise - 10/02/20 0001      Knee/Hip Exercises: Aerobic   Nustep lvl 5 x 6 min UE/LE while taking subjective      Knee/Hip Exercises: Standing   Hip Abduction 10 reps;Both;3 sets    Lateral Step Up Limitations 2x10 4'' step (LLE)    Forward Step Up Limitations 2x10 4'' step (LLE)      Knee/Hip Exercises: Seated   Long Arc Quad 3 sets;10 reps;Both;Weights    Long Arc Quad Weight 4 lbs.    Sit to Sand 2 sets;10 reps      Knee/Hip Exercises: Supine    Bridges Limitations 3x10 with ER cue (RTB)    Straight Leg Raises Limitations 3x10 with 1# (LLE)      Knee/Hip Exercises: Sidelying   Hip ABduction 2 sets;10 reps;Both    Clams 3x10 - GTB                    PT Short Term Goals - 09/15/20 2319      PT SHORT TERM GOAL #1   Title Pt will be Ind in an initial HEP    Baseline started on eval    Status New    Target Date 10/06/20      PT SHORT TERM GOAL #2   Title Pt will voice understanding of measures to assist in the reduction and maintainance of L knee pain    Status New    Target Date 10/06/20             PT Long Term Goals - 09/15/20 2323      PT LONG TERM GOAL #1   Title Increase the mobility of the  L patella to assist with decreasing pain and improve function    Status New    Target Date 11/17/20      PT LONG TERM GOAL #2   Title Increase  L knee strength to 5/5 and L hip to 4+/5 to assist with decreasing pain and improving function    Status New    Target Date 11/17/20      PT LONG TERM GOAL #3   Title Pt wil report a decrease in pain range at work to 3-5/10    Baseline 5-7/10    Status New    Target Date 11/17/20      PT LONG TERM GOAL #4   Title Pt's FOTO score will improve to 67% ability    Baseline 53% ability    Status New    Target Date 11/17/20      PT LONG TERM GOAL #5   Title Pt will be Ind in a final HEP to maintain or progress achieved LOF    Status New    Target Date 11/17/20                 Plan - 10/02/20 1640    Clinical Impression Statement Pt progressing well with therapy.  She shows ability to complete increased therex volume today as well as intensity.  She fatigues with quad centered exercises, but is able to complete with good form.  Continue progressing quad strengthening/LE strengthening as tolerated.    PT Treatment/Interventions ADLs/Self Care Home Management;Cryotherapy;Electrical Stimulation;Iontophoresis 4mg /ml Dexamethasone;Moist Heat;Ultrasound;Therapeutic  exercise;Therapeutic activities;Patient/family education;Manual techniques;Passive range of motion;Dry needling;Taping;Vasopneumatic Device;Joint Manipulations;Aquatic Therapy    PT Next Visit Plan progress exercises as able    PT Home Exercise Plan [id number]           Patient will benefit from skilled therapeutic intervention in order to improve the following deficits and impairments:  Decreased strength,Pain,Decreased activity tolerance,Obesity,Decreased range of motion  Visit Diagnosis: Primary osteoarthritis of one knee, left  Decreased ROM of left knee  Muscle weakness (generalized)  Chronic pain of left knee     Problem List Patient Active Problem List   Diagnosis Date Noted  . Cervical cancer screening 12/19/2019  . Osteoarthritis of left knee 12/23/2018  . Health care maintenance 08/09/2014  . OBESITY, MODERATE 02/13/2009  . ANXIETY 07/23/2006  . TOBACCO DEPENDENCE 07/23/2006    Shearon Balo PT, DPT 10/02/20 4:58 PM  [facility] [address] [place], [place], [address] Phone: [phone]   Fax:  [phone]  Name: DEVONDA PEQUIGNOT MRN: [id number] Date of Birth: 02-26-1971

## 2020-10-09 ENCOUNTER — Encounter: Payer: Self-pay | Admitting: Physical Therapy

## 2020-10-09 ENCOUNTER — Ambulatory Visit: Payer: BC Managed Care – PPO | Admitting: Physical Therapy

## 2020-10-09 ENCOUNTER — Other Ambulatory Visit: Payer: Self-pay

## 2020-10-09 DIAGNOSIS — M1712 Unilateral primary osteoarthritis, left knee: Secondary | ICD-10-CM

## 2020-10-09 DIAGNOSIS — M25562 Pain in left knee: Secondary | ICD-10-CM

## 2020-10-09 DIAGNOSIS — M25662 Stiffness of left knee, not elsewhere classified: Secondary | ICD-10-CM

## 2020-10-09 DIAGNOSIS — G8929 Other chronic pain: Secondary | ICD-10-CM

## 2020-10-09 DIAGNOSIS — M6281 Muscle weakness (generalized): Secondary | ICD-10-CM

## 2020-10-09 NOTE — Therapy (Signed)
[place], [place], [address] Phone: [phone]   Fax:  [phone]  Physical Therapy Treatment  Patient Details  Name: Angelica Campbell MRN: [id number] Date of Birth: Mar 30, 1971 Referring Provider (PT): McDiarmid, Blane Ohara, MD   Encounter Date: 10/09/2020   PT End of Session - 10/09/20 1532    Visit Number 5    Number of Visits 9    Date for PT Re-Evaluation 11/24/20    Authorization Type BCBS COMM PPO    Authorization Time Period Reassess FOTO on the 5th and 10th visits    Progress Note Due on Visit 10    PT Start Time 1530    PT Stop Time 1612    PT Time Calculation (min) 42 min    Activity Tolerance Patient tolerated treatment well    Behavior During Therapy [facility]: [facility] Rhonda W for tasks assessed/performed           History reviewed. No pertinent past medical history.  History reviewed. No pertinent surgical history.  There were no vitals filed for this visit.   Subjective Assessment - 10/09/20 1532    Subjective Pt feels like maybe she overdid it a little today (she was up on her feet a little more today).  She feels that her knees are improving slowly.  She feels the HEP is challenging.    Pertinent History L tibial fracture in her 20s    Pain Score 5     Pain Location Knee    Pain Orientation Left                             [facility] Adult PT Treatment/Exercise - 10/09/20 0001      Knee/Hip Exercises: Aerobic   Recumbent Bike 5 min      Knee/Hip Exercises: Machines for Strengthening   Cybex Leg Press 3x10 @ 65      Knee/Hip Exercises: Standing   Abduction Limitations cybex 2x10@25 #    Extension Limitations cybex 2x10@25 #    Lateral Step Up Limitations 2x12 4'' step (LLE)    Forward Step Up Limitations 2x12 4'' step (LLE)      Knee/Hip Exercises: Seated   Long Arc Quad 3 sets;10 reps;Both;Weights    Long Arc Quad Weight 5 lbs.    Sit to Sand 2 sets;10 reps      Knee/Hip  Exercises: Supine   Straight Leg Raises Limitations 3x10 with 1# (LLE)                    PT Short Term Goals - 09/15/20 2319      PT SHORT TERM GOAL #1   Title Pt will be Ind in an initial HEP    Baseline started on eval    Status New    Target Date 10/06/20      PT SHORT TERM GOAL #2   Title Pt will voice understanding of measures to assist in the reduction and maintainance of L knee pain    Status New    Target Date 10/06/20             PT Long Term Goals - 09/15/20 2323      PT LONG TERM GOAL #1   Title Increase the mobility of the L patella to assist with decreasing pain and improve function    Status New    Target Date 11/17/20      PT LONG TERM GOAL #2  Title Increase  L knee strength to 5/5 and L hip to 4+/5 to assist with decreasing pain and improving function    Status New    Target Date 11/17/20      PT LONG TERM GOAL #3   Title Pt wil report a decrease in pain range at work to 3-5/10    Baseline 5-7/10    Status New    Target Date 11/17/20      PT LONG TERM GOAL #4   Title Pt's FOTO score will improve to 67% ability    Baseline 53% ability    Status New    Target Date 11/17/20      PT LONG TERM GOAL #5   Title Pt will be Ind in a final HEP to maintain or progress achieved LOF    Status New    Target Date 11/17/20                 Plan - 10/09/20 1551    Clinical Impression Statement Pt is progressing well with therapy.  We were able to progress step up volume today.  We moved to hip strengthening on cybex machine to increase standing tolerance which pt tolerated well.  We will continue progressive strengthening, concentrating on knee ext, as appropriate.  Pt cued for form and pacing during therex.    PT Treatment/Interventions ADLs/Self Care Home Management;Cryotherapy;Electrical Stimulation;Iontophoresis 4mg /ml Dexamethasone;Moist Heat;Ultrasound;Therapeutic exercise;Therapeutic activities;Patient/family education;Manual  techniques;Passive range of motion;Dry needling;Taping;Vasopneumatic Device;Joint Manipulations;Aquatic Therapy    PT Next Visit Plan progress exercises as able    PT Home Exercise Plan [id number]           Patient will benefit from skilled therapeutic intervention in order to improve the following deficits and impairments:  Decreased strength,Pain,Decreased activity tolerance,Obesity,Decreased range of motion  Visit Diagnosis: Primary osteoarthritis of one knee, left  Decreased ROM of left knee  Muscle weakness (generalized)  Chronic pain of left knee     Problem List Patient Active Problem List   Diagnosis Date Noted  . Cervical cancer screening 12/19/2019  . Osteoarthritis of left knee 12/23/2018  . Health care maintenance 08/09/2014  . OBESITY, MODERATE 02/13/2009  . ANXIETY 07/23/2006  . TOBACCO DEPENDENCE 07/23/2006    Mathis Dad 10/09/2020, 4:11 PM  [facility] [address] [place], [place], [address] Phone: [phone]   Fax:  [phone]  Name: CLOTEE SCHLICKER MRN: [id number] Date of Birth: May 01, 1971

## 2020-10-16 ENCOUNTER — Other Ambulatory Visit: Payer: Self-pay

## 2020-10-16 ENCOUNTER — Encounter: Payer: Self-pay | Admitting: Physical Therapy

## 2020-10-16 ENCOUNTER — Ambulatory Visit: Payer: BC Managed Care – PPO | Admitting: Physical Therapy

## 2020-10-16 DIAGNOSIS — M1712 Unilateral primary osteoarthritis, left knee: Secondary | ICD-10-CM | POA: Diagnosis not present

## 2020-10-16 DIAGNOSIS — M6281 Muscle weakness (generalized): Secondary | ICD-10-CM

## 2020-10-16 DIAGNOSIS — G8929 Other chronic pain: Secondary | ICD-10-CM

## 2020-10-16 DIAGNOSIS — M25562 Pain in left knee: Secondary | ICD-10-CM

## 2020-10-16 DIAGNOSIS — M25662 Stiffness of left knee, not elsewhere classified: Secondary | ICD-10-CM

## 2020-10-16 NOTE — Therapy (Signed)
[place], [place], [address] Phone: [phone]   Fax:  [phone]  Physical Therapy Treatment  Patient Details  Name: Angelica Campbell MRN: [id number] Date of Birth: 1971-01-09 Referring Provider (PT): McDiarmid, Blane Ohara, MD   Encounter Date: 10/16/2020   PT End of Session - 10/16/20 1529    Visit Number 6    Number of Visits 9    Date for PT Re-Evaluation 11/24/20    Authorization Type BCBS COMM PPO    Authorization Time Period Reassess FOTO on the 5th and 10th visits    Progress Note Due on Visit 10    PT Start Time 1529    Activity Tolerance Patient tolerated treatment well    Behavior During Therapy [facility] for tasks assessed/performed           History reviewed. No pertinent past medical history.  History reviewed. No pertinent surgical history.  There were no vitals filed for this visit.   Subjective Assessment - 10/16/20 1532    Subjective Pt reports that her R knee was irritated after last session. She feels that the leg press my have been the cause.  Her L knee is still bothering her but she does feel stronger.    Pertinent History L tibial fracture in her 20s    Currently in Pain? Yes    Pain Score 4     Pain Location Knee    Pain Orientation Left              [facility] PT Assessment - 10/16/20 0001      Observation/Other Assessments   Focus on Therapeutic Outcomes (FOTO)  62%                         [facility] Adult PT Treatment/Exercise - 10/16/20 0001      Therapeutic Activites    Therapeutic Activities Other Therapeutic Activities    Other Therapeutic Activities administering and reviewing FOTO score      Knee/Hip Exercises: Aerobic   Nustep lvl 6 x 6 min UE/LE while taking subjective      Knee/Hip Exercises: Standing   Abduction Limitations cybex 2x10@37 .5#    Extension Limitations cybex 2x10@37 .5#    Lateral Step Up Limitations 2x15 4'' step (LLE)    Forward  Step Up Limitations 2x15 4'' step (LLE)      Knee/Hip Exercises: Seated   Long Arc Quad 3 sets;10 reps;Both;Weights    Long Arc Quad Weight 6 lbs.    Sit to [employer] 2 sets;10 reps                    PT Short Term Goals - 09/15/20 2319      PT SHORT TERM GOAL #1   Title Pt will be Ind in an initial HEP    Baseline started on eval    Status New    Target Date 10/06/20      PT SHORT TERM GOAL #2   Title Pt will voice understanding of measures to assist in the reduction and maintainance of L knee pain    Status New    Target Date 10/06/20             PT Long Term Goals - 09/15/20 2323      PT LONG TERM GOAL #1   Title Increase the mobility of the L patella to assist with decreasing pain and improve function    Status  New    Target Date 11/17/20      PT LONG TERM GOAL #2   Title Increase  L knee strength to 5/5 and L hip to 4+/5 to assist with decreasing pain and improving function    Status New    Target Date 11/17/20      PT LONG TERM GOAL #3   Title Pt wil report a decrease in pain range at work to 3-5/10    Baseline 5-7/10    Status New    Target Date 11/17/20      PT LONG TERM GOAL #4   Title Pt's FOTO score will improve to 67% ability    Baseline 53% ability    Status New    Target Date 11/17/20      PT LONG TERM GOAL #5   Title Pt will be Ind in a final HEP to maintain or progress achieved LOF    Status New    Target Date 11/17/20                 Plan - 10/16/20 1550    Clinical Impression Statement Pt is progressing well with therapy.  She has consistently ben able to increase exercise volume and/or intensity.  We avoided the leg press today d/t increase in R knee sxs last visit.  We will continue to advance quad strengthening and general LE strengthening as able.  She has improved her photo score from 53 to 62. Continue per POC.    Clinical Decision Making Low    Rehab Potential Good    PT Frequency 1x / week    PT Duration 8 weeks     PT Next Visit Plan progress exercises as able    PT Home Exercise Plan [id number]           Patient will benefit from skilled therapeutic intervention in order to improve the following deficits and impairments:     Visit Diagnosis: Primary osteoarthritis of one knee, left  Decreased ROM of left knee  Muscle weakness (generalized)  Chronic pain of left knee     Problem List Patient Active Problem List   Diagnosis Date Noted  . Cervical cancer screening 12/19/2019  . Osteoarthritis of left knee 12/23/2018  . Health care maintenance 08/09/2014  . OBESITY, MODERATE 02/13/2009  . ANXIETY 07/23/2006  . TOBACCO DEPENDENCE 07/23/2006    Shearon Balo PT, DPT 10/16/20 4:12 PM  [facility] [address] [place], [place], [address] Phone: [phone]   Fax:  [phone]  Name: DORIANNA MCKIVER MRN: [id number] Date of Birth: April 27, 1971

## 2020-10-23 ENCOUNTER — Ambulatory Visit: Payer: BC Managed Care – PPO

## 2020-10-30 ENCOUNTER — Encounter: Payer: Self-pay | Admitting: Physical Therapy

## 2020-10-30 ENCOUNTER — Ambulatory Visit: Payer: BC Managed Care – PPO | Attending: Family Medicine | Admitting: Physical Therapy

## 2020-10-30 ENCOUNTER — Other Ambulatory Visit: Payer: Self-pay

## 2020-10-30 ENCOUNTER — Other Ambulatory Visit: Payer: Self-pay | Admitting: Student in an Organized Health Care Education/Training Program

## 2020-10-30 DIAGNOSIS — M25562 Pain in left knee: Secondary | ICD-10-CM | POA: Insufficient documentation

## 2020-10-30 DIAGNOSIS — M25662 Stiffness of left knee, not elsewhere classified: Secondary | ICD-10-CM | POA: Insufficient documentation

## 2020-10-30 DIAGNOSIS — M6281 Muscle weakness (generalized): Secondary | ICD-10-CM | POA: Diagnosis present

## 2020-10-30 DIAGNOSIS — G8929 Other chronic pain: Secondary | ICD-10-CM | POA: Diagnosis present

## 2020-10-30 DIAGNOSIS — M1712 Unilateral primary osteoarthritis, left knee: Secondary | ICD-10-CM | POA: Insufficient documentation

## 2020-10-30 NOTE — Therapy (Signed)
[place], [place], [address] Phone: [phone]   Fax:  [phone]  Physical Therapy Treatment  Patient Details  Name: Angelica Campbell MRN: [id number] Date of Birth: 07-30-1970 Referring Provider (PT): McDiarmid, Blane Ohara, MD   Encounter Date: 10/30/2020   PT End of Session - 10/30/20 1537    Visit Number 7    Number of Visits 9    Date for PT Re-Evaluation 11/24/20    Authorization Type BCBS COMM PPO    Authorization Time Period Reassess FOTO on the 5th and 10th visits    Progress Note Due on Visit 10    PT Start Time 0331    PT Stop Time 0414    PT Time Calculation (min) 43 min    Activity Tolerance Patient tolerated treatment well    Behavior During Therapy [facility] for tasks assessed/performed           History reviewed. No pertinent past medical history.  History reviewed. No pertinent surgical history.  There were no vitals filed for this visit.   Subjective Assessment - 10/30/20 1537    Subjective Pt reports that her knee is doing pretty well this week.  She reports that she has been able to take some more seated rest breaks at work which has been helpful.    Pertinent History L tibial fracture in her 20s    Currently in Pain? Yes    Pain Score 4     Pain Location Knee    Pain Orientation Left                             [facility] Adult PT Treatment/Exercise - 10/30/20 0001      Knee/Hip Exercises: Aerobic   Nustep lvl 6 x 6 min UE/LE while taking subjective      Knee/Hip Exercises: Standing   Abduction Limitations cybex 2x10@37 .5#    Extension Limitations cybex 2x12@37 .5#    Lateral Step Up Limitations 2x10 6'' step (LLE)    Forward Step Up Limitations 2x10 6'' step (LLE)      Knee/Hip Exercises: Seated   Long Arc Quad 3 sets;10 reps;Both;Weights    Long Arc Quad Weight 10 lbs.    Sit to Sand 10 reps;3 sets                    PT Short Term Goals -  09/15/20 2319      PT SHORT TERM GOAL #1   Title Pt will be Ind in an initial HEP    Baseline started on eval    Status New    Target Date 10/06/20      PT SHORT TERM GOAL #2   Title Pt will voice understanding of measures to assist in the reduction and maintainance of L knee pain    Status New    Target Date 10/06/20             PT Long Term Goals - 09/15/20 2323      PT LONG TERM GOAL #1   Title Increase the mobility of the L patella to assist with decreasing pain and improve function    Status New    Target Date 11/17/20      PT LONG TERM GOAL #2   Title Increase  L knee strength to 5/5 and L hip to 4+/5 to assist with decreasing pain and improving function    Status  New    Target Date 11/17/20      PT LONG TERM GOAL #3   Title Pt wil report a decrease in pain range at work to 3-5/10    Baseline 5-7/10    Status New    Target Date 11/17/20      PT LONG TERM GOAL #4   Title Pt's FOTO score will improve to 67% ability    Baseline 53% ability    Status New    Target Date 11/17/20      PT LONG TERM GOAL #5   Title Pt will be Ind in a final HEP to maintain or progress achieved LOF    Status New    Target Date 11/17/20                 Plan - 10/30/20 1550    Clinical Impression Statement Pt is progressing well with therapy.  we were able to progress intensity of LAQ and hip extension today.  Pt requires cuing for pacing and form with hip abduction.  She feels she is ready for D/C next session.    Rehab Potential Good    PT Frequency 1x / week    PT Duration 8 weeks    PT Next Visit Plan progress exercises as able    PT [facility]           Patient will benefit from skilled therapeutic intervention in order to improve the following deficits and impairments:     Visit Diagnosis: Primary osteoarthritis of one knee, left  Decreased ROM of left knee  Muscle weakness (generalized)  Chronic pain of left knee     Problem  List Patient Active Problem List   Diagnosis Date Noted  . Cervical cancer screening 12/19/2019  . Osteoarthritis of left knee 12/23/2018  . Health care maintenance 08/09/2014  . OBESITY, MODERATE 02/13/2009  . ANXIETY 07/23/2006  . TOBACCO DEPENDENCE 07/23/2006    Shearon Balo PT, DPT 10/30/20 4:13 PM  [facility] [address] [place], [place], [address] Phone: [phone]   Fax:  [phone]  Name: Angelica Campbell MRN: [id number] Date of Birth: 1970/12/05

## 2020-11-06 ENCOUNTER — Ambulatory Visit: Payer: BC Managed Care – PPO

## 2020-11-15 ENCOUNTER — Ambulatory Visit: Payer: BC Managed Care – PPO

## 2020-11-15 ENCOUNTER — Other Ambulatory Visit: Payer: Self-pay

## 2020-11-15 DIAGNOSIS — G8929 Other chronic pain: Secondary | ICD-10-CM

## 2020-11-15 DIAGNOSIS — M6281 Muscle weakness (generalized): Secondary | ICD-10-CM

## 2020-11-15 DIAGNOSIS — M1712 Unilateral primary osteoarthritis, left knee: Secondary | ICD-10-CM

## 2020-11-15 DIAGNOSIS — M25662 Stiffness of left knee, not elsewhere classified: Secondary | ICD-10-CM

## 2020-11-15 DIAGNOSIS — M25562 Pain in left knee: Secondary | ICD-10-CM

## 2020-11-16 NOTE — Therapy (Signed)
[place], [place], [address] Phone: [phone]   Fax:  [phone]  Physical Therapy Treatment/Discharge  Patient Details  Name: Angelica Campbell MRN: [id number] Date of Birth: 07-16-1970 Referring Provider (PT): McDiarmid, Blane Ohara, MD   Encounter Date: 11/15/2020   PT End of Session - 11/15/20 1514     Visit Number 8    Number of Visits 9    Date for PT Re-Evaluation 11/24/20    Authorization Type BCBS COMM PPO    Authorization Time Period Reassess FOTO on the 5th and 10th visits    PT Start Time 1505    PT Stop Time 1543    PT Time Calculation (min) 38 min    Activity Tolerance Patient tolerated treatment well    Behavior During Therapy [facility] for tasks assessed/performed             History reviewed. No pertinent past medical history.  History reviewed. No pertinent surgical history.  There were no vitals filed for this visit.   Subjective Assessment - 11/15/20 1515     Subjective Pt reports her R knee was bothering her last week. Overall, she reports her knee pain is not better. Today her pain has been low with he amount of time on her feet being limited.  Pt wants to continue with her HEP and check back with her MD in a few weeks re: treatment options.    Pertinent History L tibial fracture in her 57s    Limitations Lifting;House hold activities    Diagnostic tests IMPRESSION:  1. No acute osseous abnormality  2. Tricompartment arthritis of the knee with trace knee effusion  3. Possible chondrocalcinosis  4. Possible old fracture deformities of the lateral tibial plateau  and fibular head/neck.    Patient Stated Goals Less pain and lose weight    Currently in Pain? Yes    Pain Score 6     Pain Location Knee    Pain Orientation Left    Pain Descriptors / Indicators Throbbing;Sharp    Pain Type Chronic pain    Pain Onset More than a month ago    Pain Frequency Intermittent                                [facility] Adult PT Treatment/Exercise - 11/16/20 0001       Exercises   Exercises Knee/Hip      Knee/Hip Exercises: Standing   Hip ADduction Right;Left;15 reps    Hip Extension Right;Left;15 reps    Functional Squat 15 reps    Functional Squat Limitations using bar at freemotion      Knee/Hip Exercises: Seated   Long Arc Quad 10 reps;Weights;Right;Left;2 sets    Long Arc Quad Weight 5 lbs.      Knee/Hip Exercises: Supine   Straight Leg Raises Right;Left;15 reps      Knee/Hip Exercises: Sidelying   Hip ABduction Right;Left;15 reps    Clams R' l, 15x      Knee/Hip Exercises: Prone   Hip Extension Right;Left;15 reps                    PT Education - 11/16/20 1713     Education Details Final HEP    Person(s) Educated Patient    Methods Explanation    Comprehension Verbalized understanding;Returned demonstration  PT Short Term Goals - 11/15/20 1522       PT SHORT TERM GOAL #1   Status Achieved    Target Date 11/15/20      PT SHORT TERM GOAL #2   Title Pt will voice understanding of measures to assist in the reduction and maintainance of L knee pain    Status Achieved    Target Date 11/15/20               PT Long Term Goals - 11/15/20 1523       PT LONG TERM GOAL #1   Title Increase the mobility of the L patella to assist with decreasing pain and improve function    Status Achieved    Target Date 11/15/20      PT LONG TERM GOAL #2   Title Increase L knee strength to 5/5 and L hip to 4+/5 to assist with decreasing pain and improving function. 11/15/20: L knee 5/5 met. L  hip 4/5    Status Partially Met    Target Date 11/15/20      PT LONG TERM GOAL #3   Title Pt wil report a decrease in pain range at work to 3-5/10. 11/15/20: Ave 3-4/10 still can have bad days.    Baseline 5-7/10    Status Partially Met    Target Date 11/15/20      PT LONG TERM GOAL #4   Title Pt's FOTO score will improve  to 67% ability. 11/15/20: 50%    Status Not Met    Target Date 11/15/20      PT LONG TERM GOAL #5   Title Pt will be Ind in a final HEP to maintain or progress achieved LOF    Status Achieved    Target Date 11/15/20                   Plan - 11/16/20 1734     Clinical Impression Statement Pt returns to PT with her L knee pain low related to decreased work load, however last week she report her knee pain was high. At this point, the pt would like to consult with her MD re: other treament options for her knee pain. Pt reports she is ompleteting the LE strengthening exs daily. Pt is D to a HEP with her PT goals partial met.    Personal Factors and Comorbidities Profession;Past/Current Experience;Fitness;Comorbidity 1;Time since onset of injury/illness/exacerbation    Comorbidities obesity    Examination-Activity Limitations Squat;Stand    Examination-Participation Restrictions Occupation    Stability/Clinical Decision Making Stable/Uncomplicated    Rehab Potential Good    PT Frequency 1x / week    PT Duration 8 weeks    PT Treatment/Interventions ADLs/Self Care Home Management;Cryotherapy;Electrical Stimulation;Iontophoresis 69m/ml Dexamethasone;Moist Heat;Ultrasound;Therapeutic exercise;Therapeutic activities;Patient/family education;Manual techniques;Passive range of motion;Dry needling;Taping;Vasopneumatic Device;Joint Manipulations;Aquatic Therapy    PT Home Exercise Plan [id number]-Final HEP    Consulted and Agree with Plan of Care Patient             Patient will benefit from skilled therapeutic intervention in order to improve the following deficits and impairments:  Decreased strength, Pain, Decreased activity tolerance, Obesity, Decreased range of motion  Visit Diagnosis: Primary osteoarthritis of one knee, left  Decreased ROM of left knee  Muscle weakness (generalized)  Chronic pain of left knee     Problem List Patient Active Problem List   Diagnosis Date  Noted   Cervical cancer screening 12/19/2019   Osteoarthritis of left knee 12/23/2018  Health care maintenance 08/09/2014   OBESITY, MODERATE 02/13/2009   ANXIETY 07/23/2006   TOBACCO DEPENDENCE 07/23/2006   PHYSICAL THERAPY DISCHARGE SUMMARY  Visits from Start of Care: 8  Current functional level related to goals / functional outcomes: See above   Remaining deficits:  see above   Education / Equipment: HEP   Patient agrees to discharge. Patient goals were partially met. Patient is being discharged due to the patient's request.    Gar Ponto MS, PT 11/16/20 5:50 PM   [place], [place], [address] Phone: [phone]   Fax:  [phone]  Name: LESIA MONICA MRN: [id number] Date of Birth: 03/10/1971

## 2020-12-08 ENCOUNTER — Other Ambulatory Visit: Payer: Self-pay | Admitting: Student in an Organized Health Care Education/Training Program

## 2020-12-14 ENCOUNTER — Other Ambulatory Visit: Payer: Self-pay | Admitting: Student in an Organized Health Care Education/Training Program

## 2020-12-14 ENCOUNTER — Other Ambulatory Visit: Payer: Self-pay

## 2020-12-14 DIAGNOSIS — Z1231 Encounter for screening mammogram for malignant neoplasm of breast: Secondary | ICD-10-CM

## 2021-01-06 NOTE — Patient Instructions (Signed)
It was wonderful to see you today.  Please bring ALL of your medications with you to every visit.   Today we talked about:  -You are up to date on your cervical cancer screening until July 2026. If you wanted to come in in 3 years instead of 5, then that would be July 2024. -You declined the Tdap, COVID vaccines today. Please come back for these if you change your mind! I am always happy to talk more about these vaccines. -Please use the 1-800-QUIT-NOW line for additional resources and assistance with smoking cessation.    Thank you for choosing [facility].   Please call [phone] with any questions about today's appointment.  Please be sure to schedule follow up at the front  desk before you leave today.   Sharion Settler, DO PGY-2 Family Medicine

## 2021-01-06 NOTE — Progress Notes (Addendum)
SUBJECTIVE:   CHIEF COMPLAINT / HPI:   Vaginal Check Up Angelica Campbell is a 50 y.o. female who presents today to request vaginal check-up. Last Pap smear  with co-testing in July 2021 and demonstrated NILM and negative HPV. She is up to date until July 2026. She denies any vaginal concerns at this time. She still has periods that last 2-3 days. Sometimes twice a month or sometimes every month. This has been going on for about 6-7 months. She states "I wouldn't call them irregular". She had two periods in July but the second one was at the end of July and she thinks that was probably her August period. Her periods have always only lasted 2-3 days. Also states that last year she was advised that she did not need to get Pap smears every year but states that she likes to do this because she "cannot see down there".  Also notes close friends and family with history of breast and ovarian cancer.   Health Maintenance COVID shots and Tdap offered but declines vaccination. Declines HIV and Hepatitis C screening today.  Tobacco Use Smokes 1 pack every 2 days. Has been smoking since the age of 3, about 30 years total off and on. Previously has tried to quit with Nicotine patches. States that she will quit when she gets tired and she is just not at that point yet. This is how she quit drinking, she just got tired of it.    PERTINENT  PMH / PSH: Osteoarthritis, obesity, tobacco dependence   OBJECTIVE:   BP 112/70   Pulse 92   Ht '5\' 1"'$  (1.549 m)   Wt 208 lb 4 oz (94.5 kg)   LMP 12/20/2020   SpO2 99%   BMI 39.35 kg/m    General: NAD, pleasant, able to participate in exam Cardiac: RRR Respiratory: CTAB, normal effort, No wheezes, rales or rhonchi Extremities: no edema Skin: warm and dry, no rashes noted Psych: Normal affect and mood  ASSESSMENT/PLAN:   Cervical cancer screening She is up-to-date on cervical cancer screening.  We discussed reasons for not testing every year including  potential for false positives and unnecessary procedures. Next due for Pap smear and HPV testing in 2026 but she does not think she could go 5 years until next check- she is amenable to waiting at least 3 years until next check which would be 2024. Offered STI testing today but she declines. We discussed possibility of pelvic ultrasound and possible endometrial biopsy for what sounds like irregular bleeding. Patient does not believe her periods are irregular at this point and would just like to monitor for now. If any changes occur in the next couple months, she will let me know.   Health care maintenance Discussed benefits of COVID vaccination but she declines.  States that she would need to think about it further, she is very hesitant about although think she is heard about the vaccine.  I encouraged her to ask questions and stated I am always happy to discuss this further. Also declined Tdap vaccination. Discussed recommendation for one-time HIV and Hepatitis C screening but she declines.   TOBACCO DEPENDENCE She is still smoking, reports 1 pack about every 2 to 3 days.  She is long history of smoking, estimates about 30 years.  Upon chart review appears that she is previously been prescribed nicotine patches.  She is not using anything at this time. She was given 1-800-QUIT-NOW hotline to use when she feels she  is ready to cut back. At this time she is in the pre-contemplation stage.      Sharion Settler, [place]

## 2021-01-07 ENCOUNTER — Other Ambulatory Visit: Payer: Self-pay | Admitting: Family Medicine

## 2021-01-08 ENCOUNTER — Other Ambulatory Visit: Payer: Self-pay

## 2021-01-08 ENCOUNTER — Encounter: Payer: Self-pay | Admitting: Family Medicine

## 2021-01-08 ENCOUNTER — Ambulatory Visit (INDEPENDENT_AMBULATORY_CARE_PROVIDER_SITE_OTHER): Payer: BC Managed Care – PPO | Admitting: Family Medicine

## 2021-01-08 DIAGNOSIS — Z124 Encounter for screening for malignant neoplasm of cervix: Secondary | ICD-10-CM

## 2021-01-08 DIAGNOSIS — Z0000 Encounter for general adult medical examination without abnormal findings: Secondary | ICD-10-CM | POA: Diagnosis not present

## 2021-01-08 DIAGNOSIS — F17200 Nicotine dependence, unspecified, uncomplicated: Secondary | ICD-10-CM

## 2021-01-08 NOTE — Assessment & Plan Note (Addendum)
She is up-to-date on cervical cancer screening.  We discussed reasons for not testing every year including potential for false positives and unnecessary procedures. Next due for Pap smear and HPV testing in 2026 but she does not think she could go 5 years until next check- she is amenable to waiting at least 3 years until next check which would be 2024. Offered STI testing today but she declines. We discussed possibility of pelvic ultrasound and possible endometrial biopsy for what sounds like irregular bleeding. Patient does not believe her periods are irregular at this point and would just like to monitor for now. If any changes occur in the next couple months, she will let me know.

## 2021-01-08 NOTE — Assessment & Plan Note (Signed)
Discussed benefits of COVID vaccination but she declines.  States that she would need to think about it further, she is very hesitant about although think she is heard about the vaccine.  I encouraged her to ask questions and stated I am always happy to discuss this further. Also declined Tdap vaccination. Discussed recommendation for one-time HIV and Hepatitis C screening but she declines.

## 2021-01-08 NOTE — Assessment & Plan Note (Signed)
She is still smoking, reports 1 pack about every 2 to 3 days.  She is long history of smoking, estimates about 30 years.  Upon chart review appears that she is previously been prescribed nicotine patches.  She is not using anything at this time. She was given 1-800-QUIT-NOW hotline to use when she feels she is ready to cut back. At this time she is in the pre-contemplation stage.

## 2021-01-28 NOTE — Progress Notes (Signed)
    SUBJECTIVE:   CHIEF COMPLAINT / HPI:   Discuss Paperwork Patient presents today to discuss FMLA paperwork. Given the osteoarthritis in her knee, she requets a 40 hour work week with breaks to sit down and occasional days off for flares. She has been participating in Physical Therapy for her knee and takes Meloxicam for the pain. 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   PERTINENT  PMH / PSH: Osteoarthritis of knee    OBJECTIVE:   Vitals:   01/29/21 1558 01/29/21 1641  BP: (!) 151/94 (!) 155/98  Pulse: 87   Weight: 211 lb 12.8 oz (96.1 kg)   SpO2: 100%   BMI (Calculated): 40.04    General: NAD, pleasant, able to participate in exam Respiratory: Breathing comfortably on room air, in no respiratory distress Skin: warm and dry, no rashes noted Psych: Normal affect and mood  ASSESSMENT/PLAN:   Osteoarthritis of left knee FMLA paperwork filled out today.  I will see patient again in 6 months for renewal. She should continue PT and conservative treatments.   Elevated blood pressure reading Had 2 elevated blood pressure readings today.  Patient is not on any antihypertensive medications.  She reports feeling slightly stressed from work.  Denies any other symptoms.  Advised her to schedule an appointment for follow-up for repeat blood pressure check. Encouraged tobacco cessation.a     Sharion Settler, [place]

## 2021-01-28 NOTE — Patient Instructions (Addendum)
It was wonderful to see you again, today!  Please bring ALL of your medications with you to every visit.   Today we talked about:  -I filled out your FMLA paperwork. -I will see you again in March.  Thank you for choosing [facility].   Please call [phone] with any questions about today's appointment.  Please be sure to schedule follow up at the front  desk before you leave today.   Sharion Settler, DO PGY-2 Family Medicine

## 2021-01-29 ENCOUNTER — Ambulatory Visit (INDEPENDENT_AMBULATORY_CARE_PROVIDER_SITE_OTHER): Payer: BC Managed Care – PPO | Admitting: Family Medicine

## 2021-01-29 ENCOUNTER — Encounter: Payer: Self-pay | Admitting: Family Medicine

## 2021-01-29 ENCOUNTER — Other Ambulatory Visit: Payer: Self-pay

## 2021-01-29 DIAGNOSIS — M1712 Unilateral primary osteoarthritis, left knee: Secondary | ICD-10-CM | POA: Diagnosis not present

## 2021-01-29 DIAGNOSIS — I10 Essential (primary) hypertension: Secondary | ICD-10-CM | POA: Insufficient documentation

## 2021-01-29 DIAGNOSIS — R030 Elevated blood-pressure reading, without diagnosis of hypertension: Secondary | ICD-10-CM | POA: Diagnosis not present

## 2021-01-29 NOTE — Assessment & Plan Note (Signed)
Had 2 elevated blood pressure readings today.  Patient is not on any antihypertensive medications.  She reports feeling slightly stressed from work.  Denies any other symptoms.  Advised her to schedule an appointment for follow-up for repeat blood pressure check. Encouraged tobacco cessation.a

## 2021-01-29 NOTE — Assessment & Plan Note (Signed)
FMLA paperwork filled out today.  I will see patient again in 6 months for renewal. She should continue PT and conservative treatments.

## 2021-02-05 ENCOUNTER — Ambulatory Visit
Admission: RE | Admit: 2021-02-05 | Discharge: 2021-02-05 | Disposition: A | Discharge status: Home / Self Care | Payer: BC Managed Care – PPO | Source: Ambulatory Visit | Attending: Student in an Organized Health Care Education/Training Program | Admitting: Student in an Organized Health Care Education/Training Program

## 2021-02-05 ENCOUNTER — Other Ambulatory Visit: Payer: Self-pay | Admitting: Family Medicine

## 2021-02-05 ENCOUNTER — Other Ambulatory Visit: Payer: Self-pay

## 2021-02-05 DIAGNOSIS — Z1231 Encounter for screening mammogram for malignant neoplasm of breast: Secondary | ICD-10-CM

## 2021-02-25 NOTE — Progress Notes (Signed)
    SUBJECTIVE:   CHIEF COMPLAINT / HPI:   Elevated BP Follow Up Patient presents today for follow up on her elevated blood pressures during her last clinic appointment on 9/6. She has no prior diagnosis of HTN and is not on any anti-hypertensive medications. She takes meloxicam chronically for her osteoarthritis. Takes 2 pills of Tylenol nightly. She continues to smoke, a pack every 2 days. She is not ready for cessation. She denies any chest pain, leg swelling, shortness of breath, headaches or visual changes.    Health Maintenance Up to date on mammogram (02/09/21), normal results. Previously declined HIV and Hepatitis C screening. Previously declined Tdap and COVID vaccinations. Will offer again as well as Flu vaccine.   PERTINENT  PMH / PSH:  Tobacco Abuse, osteoarthritis of knee, anxiety, obesity    OBJECTIVE:   BP 132/80   Pulse 87   Ht 5\' 2"  (1.575 m)   Wt 214 lb (97.1 kg)   SpO2 98%   BMI 39.14 kg/m    General: NAD, pleasant, able to participate in exam Cardiac: RRR, no murmurs. Respiratory: CTAB, normal effort, No wheezes, rales or rhonchi Extremities: no edema or cyanosis. Skin: warm and dry, no rashes noted Neuro: alert, no obvious focal deficits Psych: Normal affect and mood  ASSESSMENT/PLAN:   Elevated blood pressure reading BP improved today. She has unfortunately gained 3 pounds since her last visit last month. She is asymptomatic. Discussed diet and exercise to keep her from needing blood pressure medications in the future.  She is provided information on the DASH diet today.  Osteoarthritis of left knee She has been using meloxicam as needed for several years.  She has no BMP on file, will check today to assess renal function.  Health care maintenance Offered COVID-vaccine, flu vaccine, tetanus vaccine, HIV and hepatitis C screening which patient denied.  She is up-to-date on both Pap smear and mammograms.   TOBACCO DEPENDENCE She continues to smoke 1  pack every 2 days.  She is in the contemplative state at this point, improvement from prior. She thinks it may be time to make a change. She was  information for smoking cessation hotline. Encouraged her to reach out for help when needed though she would like to try for herself.  Encouraged patient I congratulated her on her past success at stopping drug use and alcohol use.     Angelica Campbell, [place]

## 2021-02-25 NOTE — Patient Instructions (Addendum)
It was wonderful to see you today.  Please bring ALL of your medications with you to every visit.   Today we talked about:  -We are doing blood work today to check your kidney function given your long-term use of meloxicam.  -Your blood pressure was much better today. -Continue your efforts to cut down on smoking, I know you can do it!  The smoking cessation hotline is 1-800-QUIT-NOW   Thank you for choosing [facility].   Please call [phone] with any questions about today's appointment.  Please be sure to schedule follow up at the front  desk before you leave today.   Sharion Settler, DO PGY-2 Family Medicine   DASH Eating Plan DASH stands for Dietary Approaches to Stop Hypertension. The DASH eating plan is a healthy eating plan that has been shown to: Reduce high blood pressure (hypertension). Reduce your risk for type 2 diabetes, heart disease, and stroke. Help with weight loss. What are tips for following this plan? Reading food labels Check food labels for the amount of salt (sodium) per serving. Choose foods with less than 5 percent of the Daily Value of sodium. Generally, foods with less than 300 milligrams (mg) of sodium per serving fit into this eating plan. To find whole grains, look for the word "whole" as the first word in the ingredient list. Shopping Buy products labeled as "low-sodium" or "no salt added." Buy fresh foods. Avoid canned foods and pre-made or frozen meals. Cooking Avoid adding salt when cooking. Use salt-free seasonings or herbs instead of table salt or sea salt. Check with your health care provider or pharmacist before using salt substitutes. Do not fry foods. Cook foods using healthy methods such as baking, boiling, grilling, roasting, and broiling instead. Cook with heart-healthy oils, such as olive, canola, avocado, soybean, or sunflower oil. Meal planning  Eat a balanced diet that includes: 4 or more servings of fruits  and 4 or more servings of vegetables each day. Try to fill one-half of your plate with fruits and vegetables. 6-8 servings of whole grains each day. Less than 6 oz (170 g) of lean meat, poultry, or fish each day. A 3-oz (85-g) serving of meat is about the same size as a deck of cards. One egg equals 1 oz (28 g). 2-3 servings of low-fat dairy each day. One serving is 1 cup (237 mL). 1 serving of nuts, seeds, or beans 5 times each week. 2-3 servings of heart-healthy fats. Healthy fats called omega-3 fatty acids are found in foods such as walnuts, flaxseeds, fortified milks, and eggs. These fats are also found in cold-water fish, such as sardines, salmon, and mackerel. Limit how much you eat of: Canned or prepackaged foods. Food that is high in trans fat, such as some fried foods. Food that is high in saturated fat, such as fatty meat. Desserts and other sweets, sugary drinks, and other foods with added sugar. Full-fat dairy products. Do not salt foods before eating. Do not eat more than 4 egg yolks a week. Try to eat at least 2 vegetarian meals a week. Eat more home-cooked food and less restaurant, buffet, and fast food. Lifestyle When eating at a restaurant, ask that your food be prepared with less salt or no salt, if possible. If you drink alcohol: Limit how much you use to: 0-1 drink a day for women who are not pregnant. 0-2 drinks a day for men. Be aware of how much alcohol is in your drink. In the  [place] one drink equals one 12 oz bottle of beer (355 mL), one 5 oz glass of wine (148 mL), or one 1 oz glass of hard liquor (44 mL). General information Avoid eating more than 2,300 mg of salt a day. If you have hypertension, you may need to reduce your sodium intake to 1,500 mg a day. Work with your health care provider to maintain a healthy body weight or to lose weight. Ask what an ideal weight is for you. Get at least 30 minutes of exercise that causes your heart to beat faster (aerobic  exercise) most days of the week. Activities may include walking, swimming, or biking. Work with your health care provider or dietitian to adjust your eating plan to your individual calorie needs. What foods should I eat? Fruits All fresh, dried, or frozen fruit. Canned fruit in natural juice (without added sugar). Vegetables Fresh or frozen vegetables (raw, steamed, roasted, or grilled). Low-sodium or reduced-sodium tomato and vegetable juice. Low-sodium or reduced-sodium tomato sauce and tomato paste. Low-sodium or reduced-sodium canned vegetables. Grains Whole-grain or whole-wheat bread. Whole-grain or whole-wheat pasta. Brown rice. [place]. Bulgur. Whole-grain and low-sodium cereals. Pita bread. Low-fat, low-sodium crackers. Whole-wheat flour tortillas. Meats and other proteins Skinless chicken or [place]. Ground chicken or [place]. Pork with fat trimmed off. Fish and seafood. Egg whites. Dried beans, peas, or lentils. Unsalted nuts, nut butters, and seeds. Unsalted canned beans. Lean cuts of beef with fat trimmed off. Low-sodium, lean precooked or cured meat, such as sausages or meat loaves. Dairy Low-fat (1%) or fat-free (skim) milk. Reduced-fat, low-fat, or fat-free cheeses. Nonfat, low-sodium ricotta or cottage cheese. Low-fat or nonfat yogurt. Low-fat, low-sodium cheese. Fats and oils Soft margarine without trans fats. Vegetable oil. Reduced-fat, low-fat, or light mayonnaise and salad dressings (reduced-sodium). Canola, safflower, olive, avocado, soybean, and sunflower oils. Avocado. Seasonings and condiments Herbs. Spices. Seasoning mixes without salt. Other foods Unsalted popcorn and pretzels. Fat-free sweets. The items listed above may not be a complete list of foods and beverages you can eat. Contact a dietitian for more information. What foods should I avoid? Fruits Canned fruit in a light or heavy syrup. Fried fruit. Fruit in cream or butter sauce. Vegetables Creamed or  fried vegetables. Vegetables in a cheese sauce. Regular canned vegetables (not low-sodium or reduced-sodium). Regular canned tomato sauce and paste (not low-sodium or reduced-sodium). Regular tomato and vegetable juice (not low-sodium or reduced-sodium). Angie Fava. Olives. Grains Baked goods made with fat, such as croissants, muffins, or some breads. Dry pasta or rice meal packs. Meats and other proteins Fatty cuts of meat. Ribs. Fried meat. Berniece Salines. Bologna, salami, and other precooked or cured meats, such as sausages or meat loaves. Fat from the back of a pig (fatback). Bratwurst. Salted nuts and seeds. Canned beans with added salt. Canned or smoked fish. Whole eggs or egg yolks. Chicken or [place] with skin. Dairy Whole or 2% milk, cream, and half-and-half. Whole or full-fat cream cheese. Whole-fat or sweetened yogurt. Full-fat cheese. Nondairy creamers. Whipped toppings. Processed cheese and cheese spreads. Fats and oils Butter. Stick margarine. Lard. Shortening. Ghee. Bacon fat. Tropical oils, such as coconut, palm kernel, or palm oil. Seasonings and condiments Onion salt, garlic salt, seasoned salt, table salt, and sea salt. Worcestershire sauce. Tartar sauce. Barbecue sauce. Teriyaki sauce. Soy sauce, including reduced-sodium. Steak sauce. Canned and packaged gravies. Fish sauce. Oyster sauce. Cocktail sauce. Store-bought horseradish. Ketchup. Mustard. Meat flavorings and tenderizers. Bouillon cubes. Hot sauces. Pre-made or packaged marinades. Pre-made or packaged taco  seasonings. Relishes. Regular salad dressings. Other foods Salted popcorn and pretzels. The items listed above may not be a complete list of foods and beverages you should avoid. Contact a dietitian for more information. Where to find more information National Heart, Lung, and Blood Institute: https://wilson-eaton.com/ American Heart Association: www.heart.org Academy of Nutrition and Dietetics: www.eatright.[place]:  www.kidney.org Summary The DASH eating plan is a healthy eating plan that has been shown to reduce high blood pressure (hypertension). It may also reduce your risk for type 2 diabetes, heart disease, and stroke. When on the DASH eating plan, aim to eat more fresh fruits and vegetables, whole grains, lean proteins, low-fat dairy, and heart-healthy fats. With the DASH eating plan, you should limit salt (sodium) intake to 2,300 mg a day. If you have hypertension, you may need to reduce your sodium intake to 1,500 mg a day. Work with your health care provider or dietitian to adjust your eating plan to your individual calorie needs. This information is not intended to replace advice given to you by your health care provider. Make sure you discuss any questions you have with your health care provider. Document Revised: 04/15/2019 Document Reviewed: 04/15/2019 Elsevier Patient Education  2022 [employer].

## 2021-02-26 ENCOUNTER — Ambulatory Visit (INDEPENDENT_AMBULATORY_CARE_PROVIDER_SITE_OTHER): Payer: BC Managed Care – PPO | Admitting: Family Medicine

## 2021-02-26 ENCOUNTER — Other Ambulatory Visit: Payer: Self-pay

## 2021-02-26 VITALS — BP 132/80 | HR 87 | Ht 62.0 in | Wt 214.0 lb

## 2021-02-26 DIAGNOSIS — R030 Elevated blood-pressure reading, without diagnosis of hypertension: Secondary | ICD-10-CM

## 2021-02-26 DIAGNOSIS — Z791 Long term (current) use of non-steroidal anti-inflammatories (NSAID): Secondary | ICD-10-CM | POA: Diagnosis not present

## 2021-02-26 DIAGNOSIS — M1712 Unilateral primary osteoarthritis, left knee: Secondary | ICD-10-CM

## 2021-02-26 DIAGNOSIS — Z0000 Encounter for general adult medical examination without abnormal findings: Secondary | ICD-10-CM | POA: Diagnosis not present

## 2021-02-26 DIAGNOSIS — F17200 Nicotine dependence, unspecified, uncomplicated: Secondary | ICD-10-CM

## 2021-02-26 NOTE — Assessment & Plan Note (Addendum)
She continues to smoke 1 pack every 2 days.  She is in the contemplative state at this point, improvement from prior. She thinks it may be time to make a change. She was  information for smoking cessation hotline. Encouraged her to reach out for help when needed though she would like to try for herself.  Encouraged patient I congratulated her on her past success at stopping drug use and alcohol use.

## 2021-02-26 NOTE — Assessment & Plan Note (Signed)
Offered COVID-vaccine, flu vaccine, tetanus vaccine, HIV and hepatitis C screening which patient denied.  She is up-to-date on both Pap smear and mammograms.

## 2021-02-26 NOTE — Assessment & Plan Note (Signed)
She has been using meloxicam as needed for several years.  She has no BMP on file, will check today to assess renal function.

## 2021-02-26 NOTE — Assessment & Plan Note (Addendum)
BP improved today. She has unfortunately gained 3 pounds since her last visit last month. She is asymptomatic. Discussed diet and exercise to keep her from needing blood pressure medications in the future.  She is provided information on the DASH diet today.

## 2021-03-01 LAB — BASIC METABOLIC PANEL
BUN/Creatinine Ratio: 18 (ref 9–23)
BUN: 14 mg/dL (ref 6–24)
CO2: 18 mmol/L — ABNORMAL LOW (ref 20–29)
Calcium: 8.9 mg/dL (ref 8.7–10.2)
Chloride: 102 mmol/L (ref 96–106)
Creatinine, Ser: 0.78 mg/dL (ref 0.57–1.00)
Glucose: 77 mg/dL (ref 70–99)
Potassium: 4.4 mmol/L (ref 3.5–5.2)
Sodium: 137 mmol/L (ref 134–144)
eGFR: 93 mL/min/{1.73_m2} (ref >59)

## 2021-03-08 ENCOUNTER — Other Ambulatory Visit: Payer: Self-pay | Admitting: Family Medicine

## 2021-04-14 ENCOUNTER — Other Ambulatory Visit: Payer: Self-pay | Admitting: Family Medicine

## 2021-05-18 ENCOUNTER — Other Ambulatory Visit: Payer: Self-pay | Admitting: Family Medicine

## 2021-07-08 ENCOUNTER — Other Ambulatory Visit: Payer: Self-pay | Admitting: Family Medicine

## 2021-07-23 NOTE — Patient Instructions (Addendum)
It was wonderful to see you today. ? ?Today we talked about: ? ?-I filled out your FMLA paperwork today.  ?-You can try Voltaren gel as needed for the pain.  ?-Try to limit the use of Meloxicam. Only take when having really bad flares. Take with food.  ?-Continue your efforts towards weight loss.  ? ? ?Thank you for choosing [facility].  ? ?Please call [phone] with any questions about today's appointment. ? ?Please be sure to schedule follow up at the front  desk before you leave today.  ? ?Sharion Settler, DO ?PGY-2 Family Medicine   ?

## 2021-07-23 NOTE — Progress Notes (Signed)
? ? ?  SUBJECTIVE:  ? ?CHIEF COMPLAINT / HPI:  ? ?FMLA for Osteoarthritis  ?Angelica Campbell is a 51 y.o. female who presents to the [facility] clinic today for semi-annual FMLA paperwork. 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 She has osteoarthritis of her left knee and requests 40 hour work weeks with occasional 1-2 days off for flares. For the pain she also needs time to sit for breaks during her shift. Takes Meloxicam as needed for pain. She doesn't need to take it every day, can go a week without it. Some times when she does take it she takes two.  ? ?Right Leg Pain ?Reports some pain and tingling x 2 weeks ago when her work load increased. More in shin area. No injuries. No swelling. It was intermittent- the meloxicam helped and so did rest. The last two weeks she hasn't been on her feet working so much lately and this helped.   ? ?PERTINENT  PMH / PSH:  ?Osteoarthritis, tobacco abuse, anxiety  ? ?OBJECTIVE:  ? ?BP 138/86   Pulse 81   Wt 215 lb 3.2 oz (97.6 kg)   SpO2 99%   BMI 39.36 kg/m?   ? ?General: NAD, pleasant, able to participate in exam ?Respiratory: Breathing comfortably on room air  ?MSK: Narrowed joint space b/l, negative bulge test, non-tender to palpation, normal strength in lower extremities, ligaments intact b/l  ?Extremities: no edema or cyanosis. ?Psych: Normal affect and mood ? ?ASSESSMENT/PLAN:  ? ?Osteoarthritis of left knee ?Discussed complications of chronic meloxicam use. Patient does not use daily. Encouraged to take with food and only sparingly as needed for flares. Can take Voltaren gel as needed as this is more local to areas affected. Encouraged weight loss as this can help slow progression. Encouraged water exercise as this is easy on joints and can aid in efforts for weight loss. Renal function checked in 02/2021 and normal.  ?-FMLA paperwork completed today; patient provided with copy ? ? ?Right knee pain ?Acute intermittent, likely triggered by increased workload.  Reassuringly has been  improving as she has been resting.  Given that she has osteoarthritis of her left knee, suspect that she also may be developing osteoarthritis of her right knee as well though pain is more around her shin. No red flags, no injury. ?-Conservative treatments for now ?-If worsening or not improving, consider x-ray ? ? ?Elevated blood pressure reading ?Mildly elevated today 138/86.  Patient did smoke prior to appointment.  She has had elevated blood pressure readings in the past, would benefit from ambulatory blood pressure monitoring. ?-Recommend patient to buy blood pressure cuff ?-Scheduled for ambulatory blood pressure monitor ?-Treat as indicated ?-Limit salt intake, encouraged weight loss ?  ? ? ?Sharion Settler, DO ?[place]  ? ?

## 2021-07-25 ENCOUNTER — Other Ambulatory Visit: Payer: Self-pay

## 2021-07-25 ENCOUNTER — Ambulatory Visit (INDEPENDENT_AMBULATORY_CARE_PROVIDER_SITE_OTHER): Payer: BC Managed Care – PPO | Admitting: Family Medicine

## 2021-07-25 ENCOUNTER — Encounter: Payer: Self-pay | Admitting: Family Medicine

## 2021-07-25 DIAGNOSIS — M1712 Unilateral primary osteoarthritis, left knee: Secondary | ICD-10-CM | POA: Diagnosis not present

## 2021-07-25 DIAGNOSIS — R030 Elevated blood-pressure reading, without diagnosis of hypertension: Secondary | ICD-10-CM

## 2021-07-25 DIAGNOSIS — M25561 Pain in right knee: Secondary | ICD-10-CM | POA: Insufficient documentation

## 2021-07-25 NOTE — Assessment & Plan Note (Signed)
Acute intermittent, likely triggered by increased workload.  Reassuringly has been improving as she has been resting.  Given that she has osteoarthritis of her left knee, suspect that she also may be developing osteoarthritis of her right knee as well though pain is more around her shin. No red flags, no injury. ?-Conservative treatments for now ?-If worsening or not improving, consider x-ray ? ?

## 2021-07-25 NOTE — Assessment & Plan Note (Signed)
Mildly elevated today 138/86.  Patient did smoke prior to appointment.  She has had elevated blood pressure readings in the past, would benefit from ambulatory blood pressure monitoring. ?-Recommend patient to buy blood pressure cuff ?-Scheduled for ambulatory blood pressure monitor ?-Treat as indicated ?-Limit salt intake, encouraged weight loss ?

## 2021-07-25 NOTE — Assessment & Plan Note (Signed)
Discussed complications of chronic meloxicam use. Patient does not use daily. Encouraged to take with food and only sparingly as needed for flares. Can take Voltaren gel as needed as this is more local to areas affected. Encouraged weight loss as this can help slow progression. Encouraged water exercise as this is easy on joints and can aid in efforts for weight loss. Renal function checked in 02/2021 and normal.  ?-FMLA paperwork completed today; patient provided with copy ? ?

## 2021-08-07 ENCOUNTER — Other Ambulatory Visit: Payer: Self-pay | Admitting: Family Medicine

## 2021-08-16 ENCOUNTER — Other Ambulatory Visit: Payer: Self-pay

## 2021-08-16 ENCOUNTER — Ambulatory Visit (INDEPENDENT_AMBULATORY_CARE_PROVIDER_SITE_OTHER): Payer: BC Managed Care – PPO | Admitting: Pharmacist

## 2021-08-16 DIAGNOSIS — R030 Elevated blood-pressure reading, without diagnosis of hypertension: Secondary | ICD-10-CM

## 2021-08-16 NOTE — Assessment & Plan Note (Addendum)
History of elevated blood pressure readings.  Set up with amb BP monitor.Instructed how to turn off device on Saturday morning (tomorrow).  Patient is working on Monday, so will have son bring monitor to office prior to 9:00 AM.  ?- Plan to call patient with result after 3:00 PM on Monday 08/19/2021 ? ?Elevated blood pressure during measurement period of work and after work/evening.  Approximately 30 of 40 measured readings demonstrate elevated blood pressure higher than goal.  Average awake reading:  147/86.  Discussed results with patient and patient was agreeable to initiation of medication therapy.  ?Following discussion we agreed to initiate Amlodipine '5mg'$  once daily.  Patient educated on purpose, proper use and potential adverse effects of peripheral edema.  Following instruction patient verbalized understanding of treatment plan.  ?New prescription provided.  ? ?Asked patient to follow-up with a blood pressure check / nurse visit in two-three weeks.  She was agreeable.  ?Plan will be to follow-up if blood pressure is < 140/90 at that visit, next follow-up with Dr. Nita Sells.   If blood pressure remains elevated > 140/90 then patient should be scheduled back with pharmacy clinic in early May.  ?

## 2021-08-16 NOTE — Patient Instructions (Signed)
Blood Pressure Activity Diary ?Time Lying down/ Sleeping Walking/ Exercise Stressed/ Angry Headache/ Pain Dizzy  ?9 AM       ?10 AM       ?11 AM       ?12 PM       ?1 PM       ?2 PM       ?Time Lying down/ Sleeping Walking/ Exercise Stressed/ Angry Headache/ Pain Dizzy  ?3 PM       ?4 PM        ?5 PM       ?6 PM       ?7 PM       ?8 PM       ?Time Lying down/ Sleeping Walking/ Exercise Stressed/ Angry Headache/ Pain Dizzy  ?9 PM       ?10 PM       ?11 PM       ?12 AM       ?1 AM       ?2 AM       ?3 AM       ?Time Lying down/ Sleeping Walking/ Exercise Stressed/ Angry Headache/ Pain Dizzy  ?4 AM       ?5 AM       ?6 AM       ?7 AM       ?8 AM       ?9 AM       ?10 AM       ? ?Time you woke up: _________                  Time you went to sleep:__________ ? ?Come back Monday Morning to have the monitor removed ?Call the [facility] if you have any questions before then [phone]) ? ?Wearing the Blood Pressure Monitor ?The cuff will inflate every 20 minutes during the day and every 30 minutes while you sleep. ?Your blood pressure readings will NOT display after cuff inflation ?Fill out the blood pressure-activity diary during the day, especially during activities that may affect your reading -- such as exercise, stress, walking, taking your blood pressure medications ? ?Important things to know: ?Avoid taking the monitor off for the next 24 hours, unless it causes you discomfort or pain. ?Do NOT get the monitor wet and do NOT dry to clean the monitor with any cleaning products. ?Do NOT put the monitor on anyone else's arm. ?When the cuff inflates, avoid excess movement. Let the cuffed arm hang loosely, slightly away from the body. Avoid flexing the muscles or moving the hand/fingers. ?When you go to sleep, make sure that the hose is not kinked. ?Remember to fill out the blood pressure activity diary. ?If you experience severe pain or unusual pain (not associated with getting your blood pressure checked),  remove the monitor. ? ?Troubleshooting: ? Code  Troubleshooting  ? 1  Check cuff position, tighten cuff  ? 2, 3  Remain still during reading  ? 4, 87  Check air hose connections and make sure cuff is tight  ? 85, 89  Check hose connections and make tubing is not crimped  ? 86  Push START/STOP to restart reading  ? 88, 91  Retry by pushing START/STOP  ? 90  Replace batteries. If problem persists, remove monitor and bring back to   clinic at follow up  ? 97, 98, 99  Service required - Remove monitor and bring back to clinic at follow  up  ? ? ?

## 2021-08-16 NOTE — Progress Notes (Signed)
Reviewed: I agree with the documentation and management of Dr. Koval. 

## 2021-08-16 NOTE — Progress Notes (Addendum)
? ?S:    ?Patient arrives in good spirits and ambulating without assistance. Presents to the clinic for ambulatory blood pressure evaluation.   ?Patient was referred and last seen by Primary Care Provider, Dr. Nita Sells, on 07/25/2021.  ? ?Discussed procedure for wearing the monitor and gave patient written instructions. Monitor was placed on non-dominant arm with instructions to return in the morning.  ? ?Current / Past  BP Medications include:  none ? ? ?O:  ?Physical Exam ?Constitutional:   ?   Appearance: Normal appearance.  ?Neurological:  ?   Mental Status: She is alert.  ?Psychiatric:     ?   Mood and Affect: Mood normal.     ?   Behavior: Behavior normal.     ?   Thought Content: Thought content normal.     ?   Judgment: Judgment normal.  ? ? ?Review of Systems  ?All other systems reviewed and are negative. ? ?Last 3 Office BP readings: ?BP Readings from Last 3 Encounters:  ?07/25/21 138/86  ?02/26/21 132/80  ?01/29/21 (!) 155/98  ? ? ? ?Clinical Atherosclerotic Cardiovascular Disease (ASCVD): No  ?The ASCVD Risk score (Arnett DK, et al., 2019) failed to calculate for the following reasons: ?  Cannot find a previous HDL lab ?  Cannot find a previous total cholesterol lab ? ?Basic Metabolic Panel ?   ?Component Value Date/Time  ? NA 137 02/26/2021 1556  ? K 4.4 02/26/2021 1556  ? CL 102 02/26/2021 1556  ? CO2 18 (L) 02/26/2021 1556  ? GLUCOSE 77 02/26/2021 1556  ? BUN 14 02/26/2021 1556  ? CREATININE 0.78 02/26/2021 1556  ? CALCIUM 8.9 02/26/2021 1556  ? ? ?Today's Office Blood Pressure (BP) reading: 162/93 mmHg from the ambulatory blood pressure monitor ? ?A/P: ?History of elevated blood pressure readings.  Set up with amb BP monitor.Instructed how to turn off device on Saturday morning (tomorrow).  Patient is working on Monday, so will have son bring monitor to office prior to 9:00 AM.  ?- Plan to call patient with result after 3:00 PM on Monday 08/19/2021 ? ?Total time in face-to-face counseling 17 minutes.    ?Patient seen with Zenaida Deed, PharmD, PGY 1 pharmacy resident.  ? ? ?Amb Blood Pressure Monitor Returned 08/19/2021 ?Phone Call to patient to discuss results 08/20/2021 at 3:50PM ? ?ABPM Study Data: ?Overall Mean 24hr BP:   147/86 mmHg HR: 88  ?Daytime Mean BP:  147/86 mmHg HR: 88  ?Nighttime Mean BP:  mmHg HR:  - Not obtained patient unable to tolerate cuff in the evening - removed BP cuff prior to bedtime.  ?Dipping Pattern: Unable to assess.  ?  ?A/P ?Elevated blood pressure during measurement period of work and after work/evening.  Approximately 30 of 40 measured readings demonstrate elevated blood pressure higher than goal.  Average awake reading:  147/86.  Discussed results with patient and patient was agreeable to initiation of medication therapy.  ?Following discussion we agreed to initiate Amlodipine '5mg'$  once daily.  Patient educated on purpose, proper use and potential adverse effects of peripheral edema.  Following instruction patient verbalized understanding of treatment plan.  ?New prescription provided.  ? ?Asked patient to follow-up with a blood pressure check / nurse visit in two-three weeks.  She was agreeable.  ?Plan will be to follow-up if blood pressure is < 140/90 at that visit, next follow-up with Dr. Nita Sells.   If blood pressure remains elevated > 140/90 then patient should be scheduled back with  pharmacy clinic in early May.  ? ?Chart routed for Nurse Visit BP check in 2-3 weeks.  ? ?

## 2021-08-20 MED ORDER — AMLODIPINE BESYLATE 5 MG PO TABS: 5.0000 mg | ORAL_TABLET | Freq: Every day | ORAL | 3 refills | Status: DC

## 2021-08-20 NOTE — Addendum Note (Signed)
Addended by: Leavy Cella on: 08/20/2021 04:08 PM ? ? Modules accepted: Orders ? ?

## 2021-08-29 ENCOUNTER — Ambulatory Visit: Payer: BC Managed Care – PPO

## 2021-09-03 ENCOUNTER — Ambulatory Visit: Payer: BC Managed Care – PPO

## 2021-09-03 VITALS — BP 112/62 | HR 91

## 2021-09-03 DIAGNOSIS — Z0130 Encounter for examination of blood pressure without abnormal findings: Secondary | ICD-10-CM

## 2021-09-03 NOTE — Progress Notes (Signed)
Patient here today for BP check.     ? ?Last BP was on 08/16/2021 and was 162/93. ? ?BP today is 112/62 with a pulse of 91.   ? ?Checked BP in left arm with large cuff.   ? ?Symptoms present: None.  ? ?Patient last took BP med Amlodipine last night.   ? ?Per Koval instruction patient scheduled for FU with PCP in May.  ? ? ? ? ?   ? ?

## 2021-09-08 ENCOUNTER — Other Ambulatory Visit: Payer: Self-pay | Admitting: Family Medicine

## 2021-09-17 NOTE — Progress Notes (Signed)
? ? ?  SUBJECTIVE:  ? ?CHIEF COMPLAINT / HPI:  ? ?Blood Pressure F/U ?Angelica Campbell is a 51 y.o. female who presents to the clinic today for follow up on her blood pressure. Patient was seen on 3/24 by Dr. Valentina Lucks for ambulatory blood pressure monitoring. She had an overall mean 24-hour blood pressure 147/86.  She was started on amlodipine 5 mg daily. Reports good compliance though she cannot recall if she took it today. Does not have a home blood pressure cuff. She did smoke right before appointment. 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 Denies chest pain, shortness of breath, lower extremity edema, headaches, and vision changes.   ? ?Tobacco Use Disorder ?Smoking 1/2 pack per day. Not ready to quit yet. Has been smoking since she was a teenager.  ? ?Osteoarthritis ?Still taking Meloxicam for her pain- only "once in a while".  ? ?PERTINENT  PMH / PSH:  ?Osteoarthritis, anxiety, tobacco dependence ? ?OBJECTIVE:  ? ?BP (!) 156/90   Pulse 74   Wt 213 lb 1.6 oz (96.7 kg)   SpO2 100%   BMI 38.98 kg/m?   ?Vitals:  ? 09/27/21 1326 09/27/21 1349  ?BP: (!) 157/90 (!) 156/90  ?Pulse: 74   ?SpO2: 100%   ? ?General: NAD, pleasant, able to participate in exam ?Cardiac: RRR, no murmurs. ?Respiratory: CTAB, normal effort, No wheezes, rales or rhonchi ?Extremities: no edema or cyanosis. ?Psych: Normal affect and mood ? ?ASSESSMENT/PLAN:  ? ?Hypertension ?Asymptomatic but still elevated today even on recheck. Will add on ARB and f/u in 2 weeks.  ?-Start Losartan 25 mg  ?-Return in 2 weeks ?-BP kit sent to pharmacy; patient directed to record BP and log and bring to follow-up appointment ? ?TOBACCO DEPENDENCE ?Still smoking half a pack a day, not interested in cessation at this time.  Provided with information regarding tobacco cessation options should patient reconsider in the future.  We will continue ongoing education at follow ups. Discussed how this could also be contributing to  elevated BP. ? ?OBESITY, MODERATE ?Discussed that given her BMI and hypertension, she would qualify for [facility] for weight loss.  Patient is not wanting any additional medications at this time. She will try to improve on incorporating exercise into daily routine. ? ?Osteoarthritis of left knee ?Stable.  ?-Continue Meloxicam PRN ?-Encourage weight loss/exercise  ? ?  ? ? ?Sharion Settler, DO ?[place]  ? ?

## 2021-09-27 ENCOUNTER — Encounter: Payer: Self-pay | Admitting: Family Medicine

## 2021-09-27 ENCOUNTER — Other Ambulatory Visit: Payer: Self-pay

## 2021-09-27 ENCOUNTER — Ambulatory Visit (INDEPENDENT_AMBULATORY_CARE_PROVIDER_SITE_OTHER): Payer: BC Managed Care – PPO | Admitting: Family Medicine

## 2021-09-27 DIAGNOSIS — Z6838 Body mass index (BMI) 38.0-38.9, adult: Secondary | ICD-10-CM

## 2021-09-27 DIAGNOSIS — E669 Obesity, unspecified: Secondary | ICD-10-CM | POA: Diagnosis not present

## 2021-09-27 DIAGNOSIS — I10 Essential (primary) hypertension: Secondary | ICD-10-CM

## 2021-09-27 DIAGNOSIS — M1712 Unilateral primary osteoarthritis, left knee: Secondary | ICD-10-CM

## 2021-09-27 DIAGNOSIS — F17200 Nicotine dependence, unspecified, uncomplicated: Secondary | ICD-10-CM

## 2021-09-27 MED ORDER — LOSARTAN POTASSIUM 25 MG PO TABS: 25.0000 mg | ORAL_TABLET | Freq: Every day | ORAL | 3 refills | Status: DC

## 2021-09-27 MED ORDER — BLOOD PRESSURE KIT: 1.0000 | PACK | Freq: Every day | 0 refills | Status: AC

## 2021-09-27 NOTE — Assessment & Plan Note (Signed)
Asymptomatic but still elevated today even on recheck. Will add on ARB and f/u in 2 weeks.  ?-Start Losartan 25 mg  ?-Return in 2 weeks ?-BP kit sent to pharmacy; patient directed to record BP and log and bring to follow-up appointment ?

## 2021-09-27 NOTE — Assessment & Plan Note (Signed)
Discussed that given her BMI and hypertension, she would qualify for [facility] for weight loss.  Patient is not wanting any additional medications at this time. She will try to improve on incorporating exercise into daily routine. ?

## 2021-09-27 NOTE — Patient Instructions (Signed)
It was wonderful to see you today. ? ?Today we talked about: ? ?-I sent a blood pressure kit to your pharmacy. Your insurance may or may not cover this. Regardless, I think it would be important for you to get one. ?-Please check your blood pressures and record them in a log. ?-Return in 2 weeks for a blood pressure check and for blood work. ?-Best of luck with your examinations!! ? ? ?Tobacco use is damaging to your body. It increases your risk of stroke, heart attack, lung cancer, and serious lung disease in the future. It also reduces your fertility.  ? ?Quitting tobacco is the best thing for your health but is a challenge---nicotine, a chemical in cigarettes, is highly addictive.  ? ?You can call 1 800 QUIT NOW (1-902-539-3981)---you will be connected with a Artist. They can also mail you nicotine gums, lozenges, and patches to quit.  ? ?Ask me about patches (which you wear all day) and gums (which you use when you have a craving) to help you quit.  ? ?There are safe, effective medications to help you quit-- ? ?Varencline---also called Chantix---- is the most common medication used to help people stop smoking. It starts a low dose and is increased. I recommended choosing a quit date then starting the medication 8 days before this. Side effects include mild headache, difficulty sleeping, and odd dreams. The medication is typically very well tolerated.  ? ?Bupropion---also called Zyban---- is started 1 week before your quit date. You take 1 pill for three days then increase to 1 pill twice per day. Side effects include a mild headache and anxiety---this usually goes away. Some patients experience weight loss.   ? ? ? ?Thank you for choosing [facility].  ? ?Please call [phone] with any questions about today's appointment. ? ?Please be sure to schedule follow up at the front  desk before you leave today.  ? ?Sharion Settler, DO ?PGY-2 Family Medicine   ?

## 2021-09-27 NOTE — Assessment & Plan Note (Signed)
Still smoking half a pack a day, not interested in cessation at this time.  Provided with information regarding tobacco cessation options should patient reconsider in the future.  We will continue ongoing education at follow ups. Discussed how this could also be contributing to elevated BP. ?

## 2021-09-27 NOTE — Assessment & Plan Note (Signed)
Stable.  ?-Continue Meloxicam PRN ?-Encourage weight loss/exercise  ?

## 2021-10-08 NOTE — Progress Notes (Deleted)
    SUBJECTIVE:   CHIEF COMPLAINT / HPI:   Blood Pressure F/U Angelica Campbell is a 51 y.o. female who presents to the clinic today for follow up on her blood pressure. Current medications include Amlodipine 5 mg, Losartan 25 mg. Reports good*** compliance. Home blood pressure readings range *** systolic and *** diastolic. Denies chest pain, shortness of breath, lower extremity edema, headaches, and vision changes.    PERTINENT  PMH / PSH:  No past medical history on file.   OBJECTIVE:   There were no vitals taken for this visit.   General: NAD, pleasant, able to participate in exam Cardiac: RRR, no murmurs. Respiratory: CTAB, normal effort, No wheezes, rales or rhonchi Abdomen: Bowel sounds present, nontender, nondistended, no hepatosplenomegaly. Extremities: no edema or cyanosis. Skin: warm and dry, no rashes noted Neuro: alert, no obvious focal deficits Psych: Normal affect and mood  ASSESSMENT/PLAN:   No problem-specific Assessment & Plan notes found for this encounter.      Sharion Settler, [place]

## 2021-10-09 ENCOUNTER — Other Ambulatory Visit: Payer: Self-pay | Admitting: Family Medicine

## 2021-10-11 ENCOUNTER — Encounter: Payer: Self-pay | Admitting: Family Medicine

## 2021-10-11 ENCOUNTER — Ambulatory Visit: Payer: BC Managed Care – PPO | Admitting: Family Medicine

## 2021-10-11 VITALS — BP 113/79 | HR 87 | Ht 61.0 in | Wt 216.5 lb

## 2021-10-11 DIAGNOSIS — I10 Essential (primary) hypertension: Secondary | ICD-10-CM | POA: Diagnosis not present

## 2021-10-11 NOTE — Patient Instructions (Signed)
It was wonderful to see you today.  Please bring ALL of your medications with you to every visit.   Today we talked about:  -Your blood pressure today was much improved today! -No medication changes today! -Contact your pharmacy when you need refills. -We are doing lab work to check your kidney/electrolyte function. I will let you know the results via MyChart.  Thank you for choosing [facility].   Please call [phone] with any questions about today's appointment.  Please be sure to schedule follow up at the front  desk before you leave today.   Sharion Settler, DO PGY-2 Family Medicine

## 2021-10-11 NOTE — Progress Notes (Signed)
    SUBJECTIVE:   CHIEF COMPLAINT / HPI:   Blood Pressure F/U Angelica Campbell is a 51 y.o. female who presents to the clinic today for follow up on her blood pressure. Current medications include Amlodipine 5 mg, Losartan 25 mg (started at last visit). Reports good compliance. Has not yet bought a blood pressure cuff. Denies chest pain, shortness of breath, lower extremity edema, headaches, and vision changes.    PERTINENT  PMH / PSH:  No past medical history on file.  OBJECTIVE:   BP 113/79   Pulse 87   Ht '5\' 1"'$  (1.549 m)   Wt 216 lb 8 oz (98.2 kg)   LMP 06/26/2021   SpO2 100%   BMI 40.91 kg/m    General: NAD, pleasant, able to participate in exam Cardiac: RRR, no murmurs. Respiratory: CTAB, normal effort, No wheezes, rales or rhonchi Extremities: no edema or cyanosis. Skin: warm and dry, no rashes noted Psych: Normal affect and mood  ASSESSMENT/PLAN:   Hypertension Much improved today! Patient is tolerating medications without adverse effects. She is working on obtaining home BP cuff.  -Continue Losartan and Amlodipine -Discussed lifestyle modifications  -BMP today      Sharion Settler, [place]

## 2021-10-11 NOTE — Assessment & Plan Note (Signed)
Much improved today! Patient is tolerating medications without adverse effects. She is working on obtaining home BP cuff.  -Continue Losartan and Amlodipine -Discussed lifestyle modifications  -BMP today

## 2021-10-12 LAB — BASIC METABOLIC PANEL
BUN/Creatinine Ratio: 17 (ref 9–23)
BUN: 15 mg/dL (ref 6–24)
CO2: 20 mmol/L (ref 20–29)
Calcium: 9.1 mg/dL (ref 8.7–10.2)
Chloride: 103 mmol/L (ref 96–106)
Creatinine, Ser: 0.86 mg/dL (ref 0.57–1.00)
Glucose: 86 mg/dL (ref 70–99)
Potassium: 4.7 mmol/L (ref 3.5–5.2)
Sodium: 139 mmol/L (ref 134–144)
eGFR: 82 mL/min/{1.73_m2} (ref >59)

## 2021-10-29 ENCOUNTER — Encounter: Payer: Self-pay | Admitting: *Deleted

## 2021-11-10 ENCOUNTER — Other Ambulatory Visit: Payer: Self-pay | Admitting: Family Medicine

## 2021-11-10 DIAGNOSIS — R030 Elevated blood-pressure reading, without diagnosis of hypertension: Secondary | ICD-10-CM

## 2021-11-18 ENCOUNTER — Other Ambulatory Visit: Payer: Self-pay | Admitting: Family Medicine

## 2021-12-25 ENCOUNTER — Other Ambulatory Visit: Payer: Self-pay | Admitting: Family Medicine

## 2022-01-07 ENCOUNTER — Other Ambulatory Visit: Payer: Self-pay | Admitting: Family Medicine

## 2022-01-07 DIAGNOSIS — Z1231 Encounter for screening mammogram for malignant neoplasm of breast: Secondary | ICD-10-CM

## 2022-01-27 ENCOUNTER — Other Ambulatory Visit: Payer: Self-pay | Admitting: Family Medicine

## 2022-02-04 NOTE — Progress Notes (Unsigned)
    SUBJECTIVE:   CHIEF COMPLAINT / HPI:   Angelica Campbell is a 51 y.o. female who presents to the [facility] clinic today to discuss the following concerns:   FMLA Renewal  Patient presents today for her FMLA renewal.  She has tricompartment osteoarthritis of her left knee.  This causes her to have significant pain when standing for too long.  Her request is that her job allow her to sit down when needed and for her to take 1 to 2 days off as needed per months for flares.  She is currently taking meloxicam as needed for her pain.  She reports taking this once or twice a week as needed.  She is not doing physical therapy.  She is working towards weight loss, is down 4 pounds since May.  PERTINENT  PMH / PSH: HTN, OA of knees   OBJECTIVE:   BP 122/80   Pulse 84   Ht '5\' 1"'$  (1.549 m)   Wt 212 lb 3.2 oz (96.3 kg)   SpO2 100%   BMI 40.09 kg/m    General: NAD, pleasant, able to participate in exam Respiratory: Normal respiratory effort on room air MSK: Normal gait, able to sit and stand unassisted.  Psych: Normal affect and mood  ASSESSMENT/PLAN:   Osteoarthritis of left knee FMLA paperwork completed today. Have written for sitting breaks as needed and allowed 1-2 days off a month as needed for flares.  Reminded patient again today that she should not take meloxicam frequently as it can lead to adverse effects such as ulcers and GI bleeding.  Stated that I will not provide monthly prescriptions for this since she should be using this sparingly.  Recommend that she trial Voltaren gel as this would have less systemic effects.  Recommended that she continue her efforts towards weight loss.  Hypertension Normotensive.  Continue losartan and amlodipine.  BMP is up-to-date.  TOBACCO DEPENDENCE Still smoking, not interested in cutting back at this time. Encouraged cessation.      Sharion Settler, [place]

## 2022-02-05 ENCOUNTER — Encounter: Payer: Self-pay | Admitting: Family Medicine

## 2022-02-05 ENCOUNTER — Ambulatory Visit: Payer: BC Managed Care – PPO | Admitting: Family Medicine

## 2022-02-05 DIAGNOSIS — I10 Essential (primary) hypertension: Secondary | ICD-10-CM

## 2022-02-05 DIAGNOSIS — M1712 Unilateral primary osteoarthritis, left knee: Secondary | ICD-10-CM | POA: Diagnosis not present

## 2022-02-05 DIAGNOSIS — F17200 Nicotine dependence, unspecified, uncomplicated: Secondary | ICD-10-CM | POA: Diagnosis not present

## 2022-02-05 NOTE — Assessment & Plan Note (Signed)
FMLA paperwork completed today. Have written for sitting breaks as needed and allowed 1-2 days off a month as needed for flares.  Reminded patient again today that she should not take meloxicam frequently as it can lead to adverse effects such as ulcers and GI bleeding.  Stated that I will not provide monthly prescriptions for this since she should be using this sparingly.  Recommend that she trial Voltaren gel as this would have less systemic effects.  Recommended that she continue her efforts towards weight loss.

## 2022-02-05 NOTE — Assessment & Plan Note (Addendum)
Still smoking, not interested in cutting back at this time. Encouraged cessation.

## 2022-02-05 NOTE — Assessment & Plan Note (Signed)
Normotensive.  Continue losartan and amlodipine.  BMP is up-to-date.

## 2022-02-05 NOTE — Patient Instructions (Signed)
It was wonderful to see you today.  Please bring ALL of your medications with you to every visit.   Today we talked about:  -Limit use of Meloxicam  -Use Voltaren gel as needed to areas of pain.  -Continue to work towards weight loss.   Thank you for choosing [facility].   Please call [phone] with any questions about today's appointment.  Please be sure to schedule follow up at the front  desk before you leave today.   Sharion Settler, DO PGY-3 Family Medicine

## 2022-02-06 ENCOUNTER — Ambulatory Visit: Payer: BC Managed Care – PPO

## 2022-02-10 ENCOUNTER — Other Ambulatory Visit: Payer: Self-pay | Admitting: Family Medicine

## 2022-02-10 DIAGNOSIS — R030 Elevated blood-pressure reading, without diagnosis of hypertension: Secondary | ICD-10-CM

## 2022-02-12 ENCOUNTER — Other Ambulatory Visit: Payer: Self-pay | Admitting: Family Medicine

## 2022-02-12 ENCOUNTER — Ambulatory Visit
Admission: RE | Admit: 2022-02-12 | Discharge: 2022-02-12 | Disposition: A | Discharge status: Home / Self Care | Payer: BC Managed Care – PPO | Source: Ambulatory Visit | Attending: *Deleted | Admitting: *Deleted

## 2022-02-12 DIAGNOSIS — Z1231 Encounter for screening mammogram for malignant neoplasm of breast: Secondary | ICD-10-CM

## 2022-03-17 ENCOUNTER — Ambulatory Visit: Payer: BC Managed Care – PPO

## 2022-03-17 NOTE — Progress Notes (Signed)
    SUBJECTIVE:   CHIEF COMPLAINT / HPI:   Angelica Campbell is a 51 y.o. female who presents to the [facility] clinic today to discuss the following concerns:   L Knee Pain Ongoing for almost 2 weeks now without preceding injury. The pain seems to be "easing up" but she still knows its there. She feels like her walking "is off". This doesn't feel like her typical OA pain/flare because it has lasted longer. She has been taking Tylenol, voltaren gel, and using rubbing alcohol. The medications help relieve the pain slightly. Getting up and walking makes the pain worse. Rest and medications help the pain. She felt like her knee was swollen but the swelling has improved. She reports chronically hearing snaps and clicks with her knee, nothing new though.   PERTINENT  PMH / PSH: OA of R knee   OBJECTIVE:   BP 124/82   Pulse 95   Ht '5\' 1"'$  (1.549 m)   Wt 212 lb (96.2 kg)   SpO2 98%   BMI 40.06 kg/m    General: NAD, pleasant, able to participate in exam Knee, Left: No localized TTP. Inspection was negative for erythema, ecchymosis, and effusion. No obvious bony abnormalities or signs of osteophyte development. Palpation yielded no asymmetric warmth; No joint line tenderness; No condyle tenderness; No patellar tenderness; No patellar crepitus. Patellar and quadriceps tendons unremarkable, and no tenderness of the pes anserine bursa. No obvious Baker's cyst development. ROM normal in flexion (135 degrees) and extension (0 degrees). Normal hamstring and quadriceps strength. Neurovascularly intact bilaterally.  - Ligaments: (Solid and consistent endpoints)   - ACL (present bilaterally)   - PCL (present bilaterally)   - LCL (present bilaterally)   - MCL (present bilaterally).   - Additional tests performed:    - Anterior Drawer >> NEG   - Lachman >> NEG  - Meniscus:   - Thessaly: NEG  - Patella:   - Patellar grind/compression: NEG Psych: Normal affect and mood  ASSESSMENT/PLAN:   Osteoarthritis of  left knee Subacute left knee pain that seems to be improving with conservative measures.  No preceding injury.  She does have known OA of this knee, I suspect possible flare.  Examination was unremarkable.  No erythema, swelling, or fevers to suggest gout or infectious process. -Defer imaging for now given no trauma -Continue conservative measures with Tylenol, Voltaren gel, Meloxicam PRN -Return if not improving      Sharion Settler, [place]

## 2022-03-17 NOTE — Patient Instructions (Addendum)
It was wonderful to see you today.  Please bring ALL of your medications with you to every visit.   Today we talked about:  -Continue your current pain regimen. -If you notice worsening swelling, worsening pain, or start to develop redness or fevers, please return.  -I don't think that imaging will be helpful at this time.   Thank you for coming to your visit as scheduled. We have had a large "no-show" problem lately, and this significantly limits our ability to see and care for patients. As a friendly reminder- if you cannot make your appointment please call to cancel. We do have a no show policy for those who do not cancel within 24 hours. Our policy is that if you miss or fail to cancel an appointment within 24 hours, 3 times in a 64-monthperiod, you may be dismissed from our clinic.   Thank you for choosing [facility]   Please call [phone] any questions about today's appointment.  Please be sure to schedule follow up at the front  desk before you leave today.   ASharion Settler DO PGY-3 Family Medicine

## 2022-03-18 ENCOUNTER — Ambulatory Visit: Payer: BC Managed Care – PPO | Admitting: Family Medicine

## 2022-03-18 DIAGNOSIS — M1712 Unilateral primary osteoarthritis, left knee: Secondary | ICD-10-CM

## 2022-03-18 NOTE — Assessment & Plan Note (Addendum)
Subacute left knee pain that seems to be improving with conservative measures.  No preceding injury.  She does have known OA of this knee, I suspect possible flare.  Examination was unremarkable.  No erythema, swelling, or fevers to suggest gout or infectious process. -Defer imaging for now given no trauma -Continue conservative measures with Tylenol, Voltaren gel, Meloxicam PRN -Return if not improving

## 2022-08-07 ENCOUNTER — Other Ambulatory Visit: Payer: Self-pay | Admitting: Family Medicine

## 2022-08-11 ENCOUNTER — Ambulatory Visit: Payer: BC Managed Care – PPO | Admitting: Family Medicine

## 2022-08-11 ENCOUNTER — Encounter: Payer: Self-pay | Admitting: Family Medicine

## 2022-08-11 VITALS — BP 130/78 | HR 70 | Ht 63.25 in | Wt 216.0 lb

## 2022-08-11 DIAGNOSIS — M1712 Unilateral primary osteoarthritis, left knee: Secondary | ICD-10-CM

## 2022-08-11 DIAGNOSIS — F17200 Nicotine dependence, unspecified, uncomplicated: Secondary | ICD-10-CM | POA: Diagnosis not present

## 2022-08-11 DIAGNOSIS — I10 Essential (primary) hypertension: Secondary | ICD-10-CM | POA: Diagnosis not present

## 2022-08-11 NOTE — Patient Instructions (Addendum)
It was wonderful to see you today.  Please bring ALL of your medications with you to every visit.   Today we talked about:  I would encourage you to try and do water aerobics for exercise!  Continue towards your efforts for weight loss  Return in 6 months for another appointment.  Continue the Meloxicam as needed, avoid taking often You can use the Voltaren gel more often for your pain You can use Tylenol as needed for your pain (max dose is 4000 mg in a day).   Tobacco use is damaging to your body. It increases your risk of stroke, heart attack, lung cancer, and serious lung disease in the future. It also reduces your fertility.   Quitting tobacco is the best thing for your health but is a challenge---nicotine, a chemical in cigarettes, is highly addictive.   You can call 1 800 QUIT NOW (1-928-137-3562)---you will be connected with a Artist. They can also mail you nicotine gums, lozenges, and patches to quit.   Ask me about patches (which you wear all day) and gums (which you use when you have a craving) to help you quit.   There are safe, effective medications to help you quit--  Varencline---also called Chantix---- is the most common medication used to help people stop smoking. It starts a low dose and is increased. I recommended choosing a quit date then starting the medication 8 days before this. Side effects include mild headache, difficulty sleeping, and odd dreams. The medication is typically very well tolerated.   Bupropion---also called Zyban---- is started 1 week before your quit date. You take 1 pill for three days then increase to 1 pill twice per day. Side effects include a mild headache and anxiety---this usually goes away. Some patients experience weight loss.     Thank you for coming to your visit as scheduled. We have had a large "no-show" problem lately, and this significantly limits our ability to see and care for patients. As a friendly reminder- if you  cannot make your appointment please call to cancel. We do have a no show policy for those who do not cancel within 24 hours. Our policy is that if you miss or fail to cancel an appointment within 24 hours, 3 times in a 6-month period, you may be dismissed from our clinic.   Thank you for choosing [facility].   Please call [phone] with any questions about today's appointment.  Please be sure to schedule follow up at the front  desk before you leave today.   Sharion Settler, DO PGY-3 Family Medicine

## 2022-08-11 NOTE — Progress Notes (Signed)
    SUBJECTIVE:   CHIEF COMPLAINT / HPI:   Angelica Campbell is a 52 y.o. female who presents to the [facility] clinic today to discuss the following concerns:   FMLA for L Knee OA  Patient presents to the office today for renewal of her FMLA.  She has tricompartment osteoarthritis of her left knee which causes her to have significant pain when standing for prolonged periods of time.  The FMLA allows her to have sitdown breaks when needed and to take 1 to 2 days off each month for flares.  She continues to work towards weight loss. She takes Meloxicam as needed for severe pain, not as often. She is using topical Voltaren gel for pain control as well.   PERTINENT  PMH / PSH: Obesity, hypertension, tobacco use disorder  OBJECTIVE:   BP 130/78   Pulse 70   Ht 5' 3.25" (1.607 m)   Wt 216 lb (98 kg)   LMP  (LMP Unknown)   SpO2 99%   BMI 37.96 kg/m    General: NAD, pleasant, able to participate in exam Respiratory: normal effort L Knee: no edema. No ttp of joint line. Normal ROM. Normal gait. Slightly valgus with standing. Skin: warm and dry, no rashes noted Psych: Normal affect and mood  ASSESSMENT/PLAN:   1. Primary osteoarthritis of left knee FMLA form filled out today to give her sitting breaks and allow her to take 1 to 2 days off as needed for flares each month. -Continue meloxicam PRN, use sparingly only for breakthrough pain -Continue Voltaren gel, Tylenol PRN -Encouraged water aerobics, weight loss  2. Primary hypertension Normotensive today, BMP UTD but next due in May.  -Continue amlodipine, losartan  3. TOBACCO DEPENDENCE Precontemplative state. -Tobacco cessation info provided in AVS     Sharion Settler, [place]

## 2022-09-06 ENCOUNTER — Other Ambulatory Visit: Payer: Self-pay | Admitting: Family Medicine

## 2022-10-09 ENCOUNTER — Other Ambulatory Visit: Payer: Self-pay | Admitting: Family Medicine

## 2023-01-14 ENCOUNTER — Other Ambulatory Visit: Payer: Self-pay | Admitting: Family Medicine

## 2023-01-14 ENCOUNTER — Encounter: Payer: Self-pay | Admitting: Family Medicine

## 2023-01-14 DIAGNOSIS — Z1231 Encounter for screening mammogram for malignant neoplasm of breast: Secondary | ICD-10-CM

## 2023-02-26 ENCOUNTER — Other Ambulatory Visit (HOSPITAL_COMMUNITY)
Admission: RE | Admit: 2023-02-26 | Discharge: 2023-02-26 | Disposition: A | Discharge status: Home / Self Care | Payer: BC Managed Care – PPO | Source: Ambulatory Visit | Attending: Family Medicine | Admitting: Family Medicine

## 2023-02-26 ENCOUNTER — Ambulatory Visit
Admission: RE | Admit: 2023-02-26 | Discharge: 2023-02-26 | Disposition: A | Discharge status: Home / Self Care | Payer: BC Managed Care – PPO | Source: Ambulatory Visit | Attending: Family Medicine | Admitting: Family Medicine

## 2023-02-26 ENCOUNTER — Ambulatory Visit: Payer: BC Managed Care – PPO | Admitting: Family Medicine

## 2023-02-26 ENCOUNTER — Other Ambulatory Visit: Payer: Self-pay

## 2023-02-26 ENCOUNTER — Ambulatory Visit: Payer: BC Managed Care – PPO

## 2023-02-26 ENCOUNTER — Encounter: Payer: Self-pay | Admitting: Family Medicine

## 2023-02-26 VITALS — BP 143/89 | HR 87 | Ht 63.0 in | Wt 223.6 lb

## 2023-02-26 DIAGNOSIS — I10 Essential (primary) hypertension: Secondary | ICD-10-CM | POA: Diagnosis not present

## 2023-02-26 DIAGNOSIS — Z124 Encounter for screening for malignant neoplasm of cervix: Secondary | ICD-10-CM

## 2023-02-26 DIAGNOSIS — M1712 Unilateral primary osteoarthritis, left knee: Secondary | ICD-10-CM

## 2023-02-26 DIAGNOSIS — Z1231 Encounter for screening mammogram for malignant neoplasm of breast: Secondary | ICD-10-CM

## 2023-02-26 NOTE — Progress Notes (Signed)
    SUBJECTIVE:   CHIEF COMPLAINT / HPI:   Pap Smear  Patient due for pap smear. No abnormal paps before. No vaginal symptoms.   FMLA Patient presents to the office today for renewal of her FMLA. She has tricompartment osteoarthritis of her left knee which causes her to have significant pain when standing for prolonged periods of time. The FMLA allows her to have sitdown breaks when needed and to take 1 to 2 days off each month for flares. She continues to work towards weight loss. She takes Meloxicam as needed for severe pain, not as often. She is using topical Voltaren gel for pain control as well.   Health Maintenance  Patient is due for a physical and needs labs as well as is now eligible for multiple screenings. Patient says she does not have time at this visit as she is going to a mammogram right after appointment.   PERTINENT  PMH / PSH: OA, HTN, GAD, H/o Tobacco use   OBJECTIVE:   BP (!) 143/89   Pulse 87   Ht 5\' 3"  (1.6 m)   Wt 223 lb 9.6 oz (101.4 kg)   SpO2 100%   BMI 39.61 kg/m   General: well appearing, in no acute distress CV: RRR, radial pulses equal and palpable, no BLE edema  Resp: Normal work of breathing on room air, CTAB Abd: Soft, non tender, non distended  GU( chaperoned by CMA): No CMT, no friability of cervix, otherwise normal GU exam    ASSESSMENT/PLAN:   Assessment & Plan Primary osteoarthritis of left knee Patient would greatly benefit from taking breaks at work and continuing water exercises.  - FMLA paperwork filled out  - Continue as needed meloxicam spraingly and voltaren gel  - Can consider knee injections as needed  Cervical cancer screening F/u pap today   Primary hypertension Uncontrolled today. Patient did not take medications today.  - Counseled on medication adherence.  - Will follow up at next appointment and increase medication or make adjustment as needed.   Appointment made for annual physical in 03/2023.     Lockie Mola, MD [facility]

## 2023-02-26 NOTE — Patient Instructions (Signed)
It was wonderful to see you today.  Please bring ALL of your medications with you to every visit.   Today we talked about:  Screening tests - We will discuss these more at your annual physical in December.   FMLA - I have faxed the forms. Please ask [employer] if they received them.   I will let you know the results of your pap smear  Please follow up for your annual physical  Thank you for choosing [facility].   Please call [phone] with any questions about today's appointment.  Lockie Mola, MD  Family Medicine

## 2023-02-28 ENCOUNTER — Encounter: Payer: Self-pay | Admitting: Family Medicine

## 2023-02-28 NOTE — Assessment & Plan Note (Signed)
Uncontrolled today. Patient did not take medications today.  - Counseled on medication adherence.  - Will follow up at next appointment and increase medication or make adjustment as needed.

## 2023-02-28 NOTE — Assessment & Plan Note (Signed)
Patient would greatly benefit from taking breaks at work and continuing water exercises.  - FMLA paperwork filled out  - Continue as needed meloxicam spraingly and voltaren gel  - Can consider knee injections as needed

## 2023-02-28 NOTE — Assessment & Plan Note (Signed)
F/u pap today

## 2023-03-02 LAB — CYTOLOGY - PAP
Adequacy: ABSENT
Comment: NEGATIVE
Diagnosis: NEGATIVE
High risk HPV: NEGATIVE

## 2023-03-02 NOTE — Progress Notes (Signed)
Normal pap. Next pap due in 5 years.

## 2023-04-30 IMAGING — MG MM DIGITAL SCREENING BILAT W/ TOMO AND CAD
6 of 10 series · 6 of 30 positions shown · non-contrast
Comparison: Previous exam(s).

CLINICAL DATA: Screening.

EXAM:
DIGITAL SCREENING BILATERAL MAMMOGRAM WITH TOMOSYNTHESIS AND CAD
TECHNIQUE: Bilateral screening digital craniocaudal and mediolateral oblique
mammograms were obtained. Bilateral screening digital breast
tomosynthesis was performed. The images were evaluated with
computer-aided detection.

[R CC synth-2D]
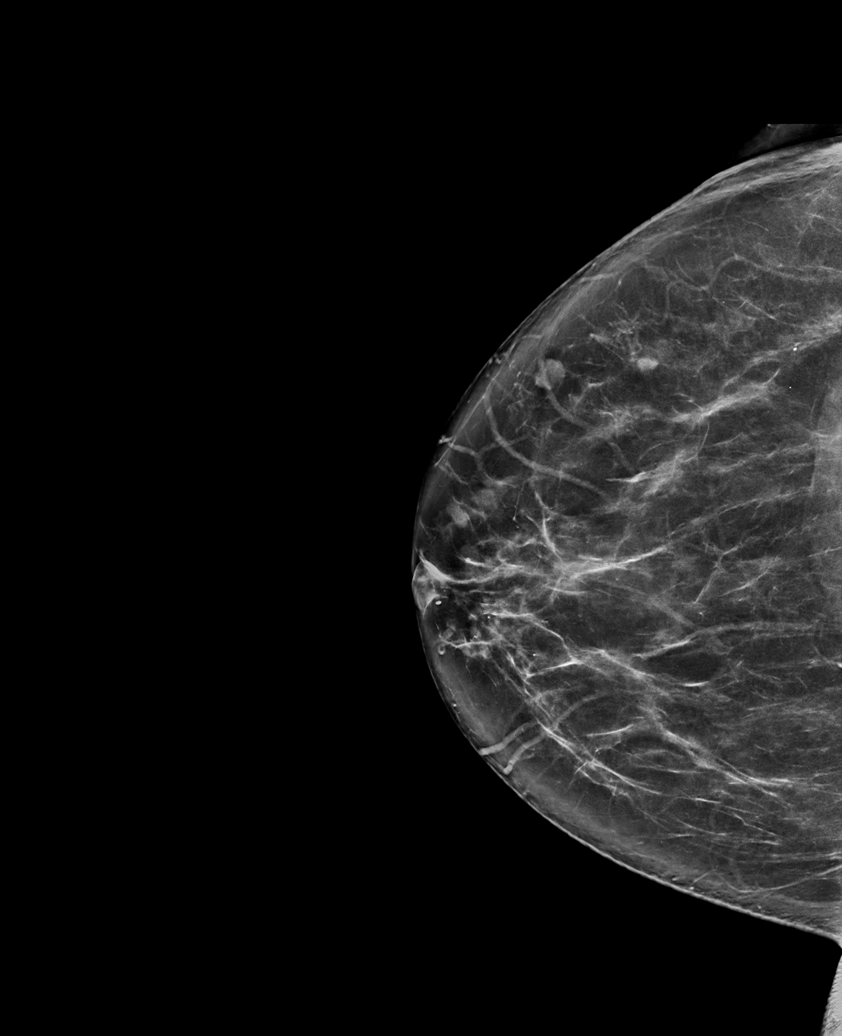

[R MLO synth-2D]
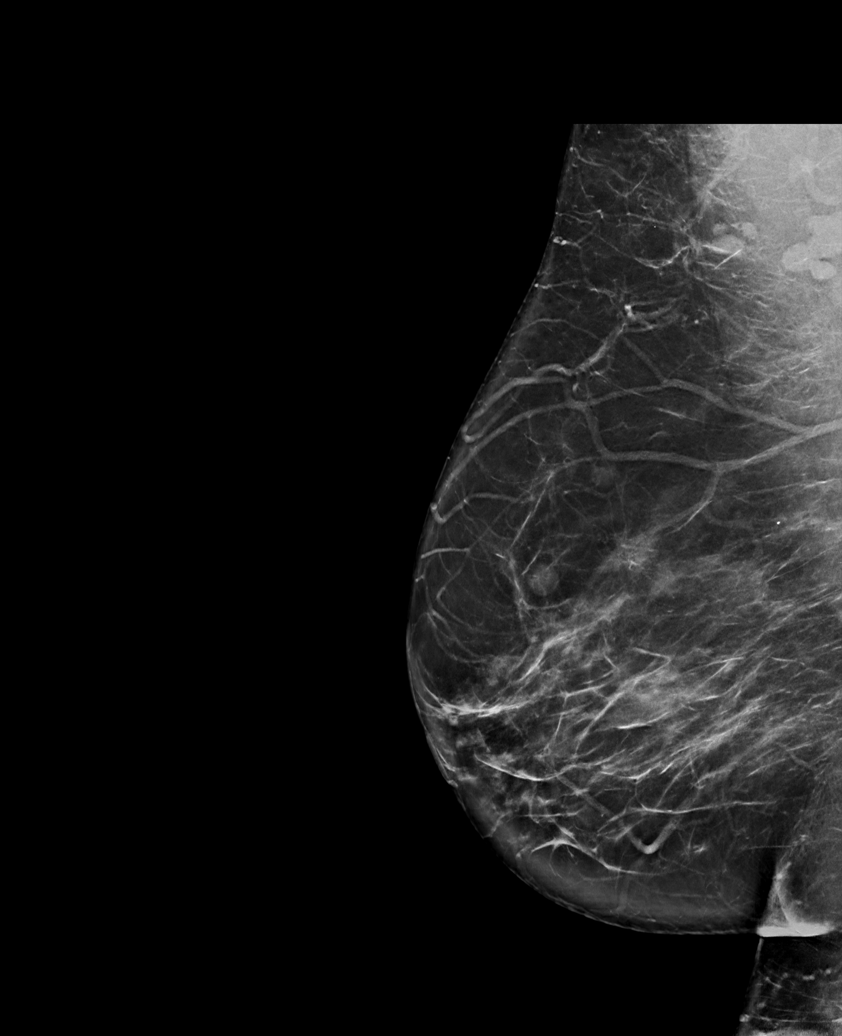

[L CC synth-2D (1 of 2)]
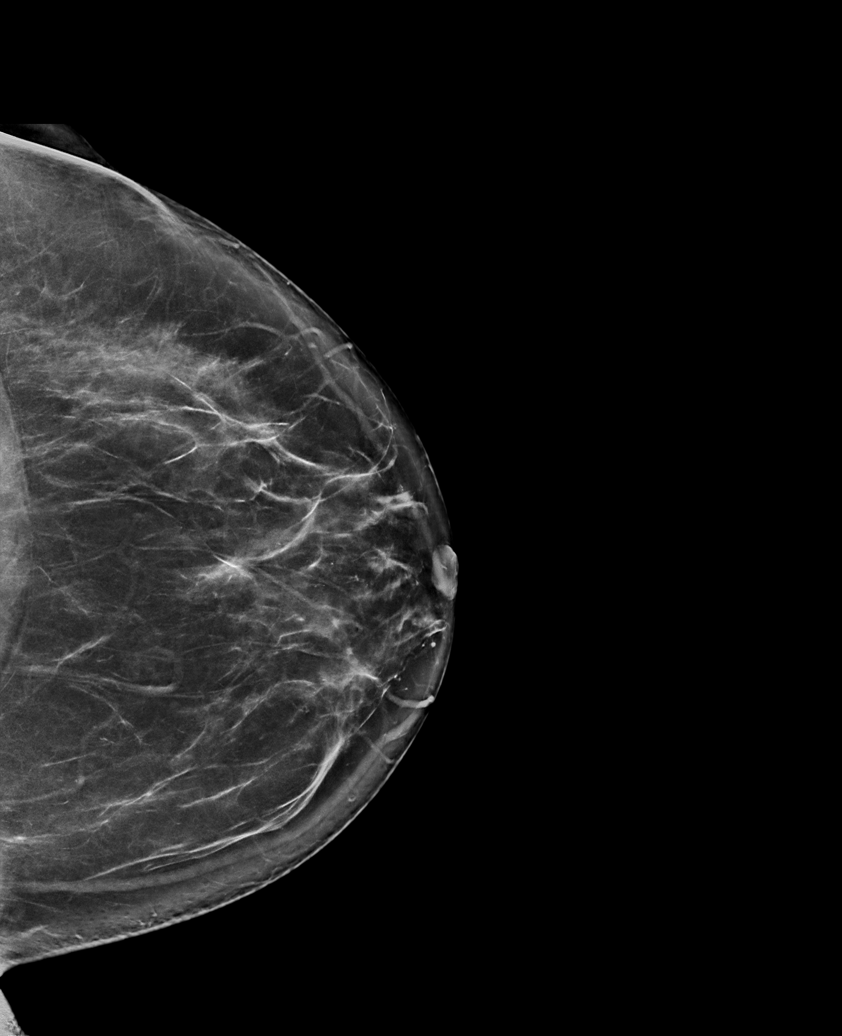

[L MLO synth-2D]
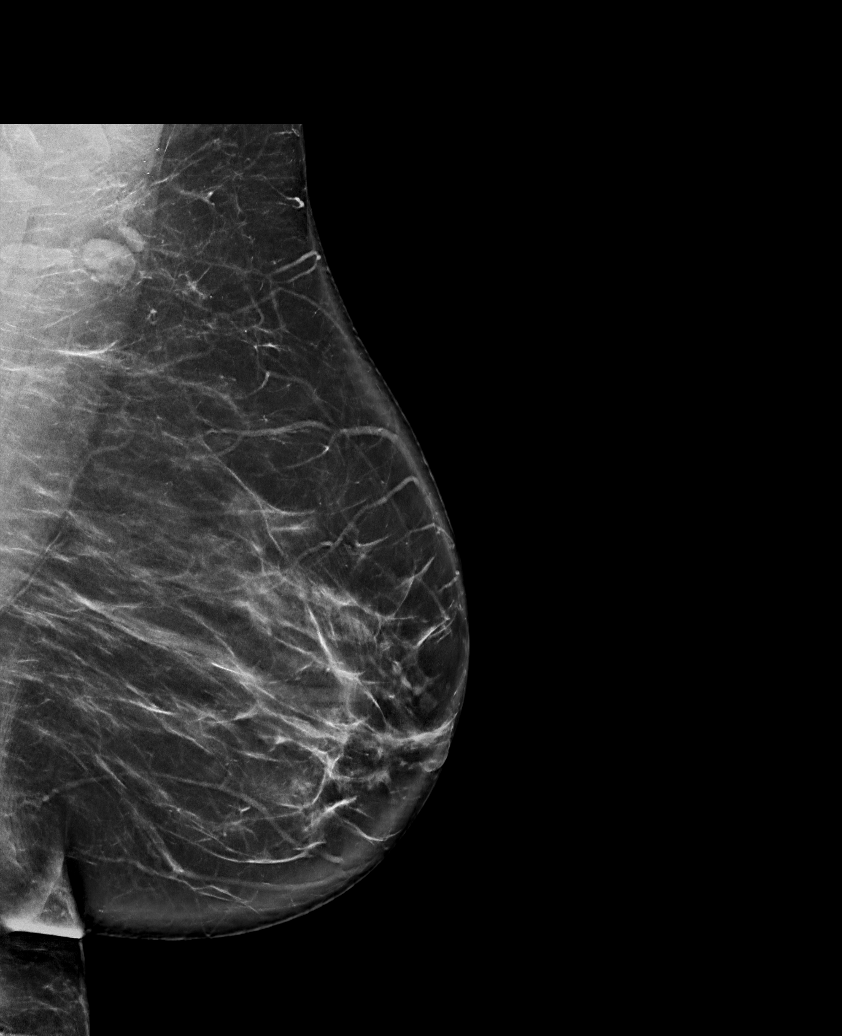

[L CC synth-2D (2 of 2)]
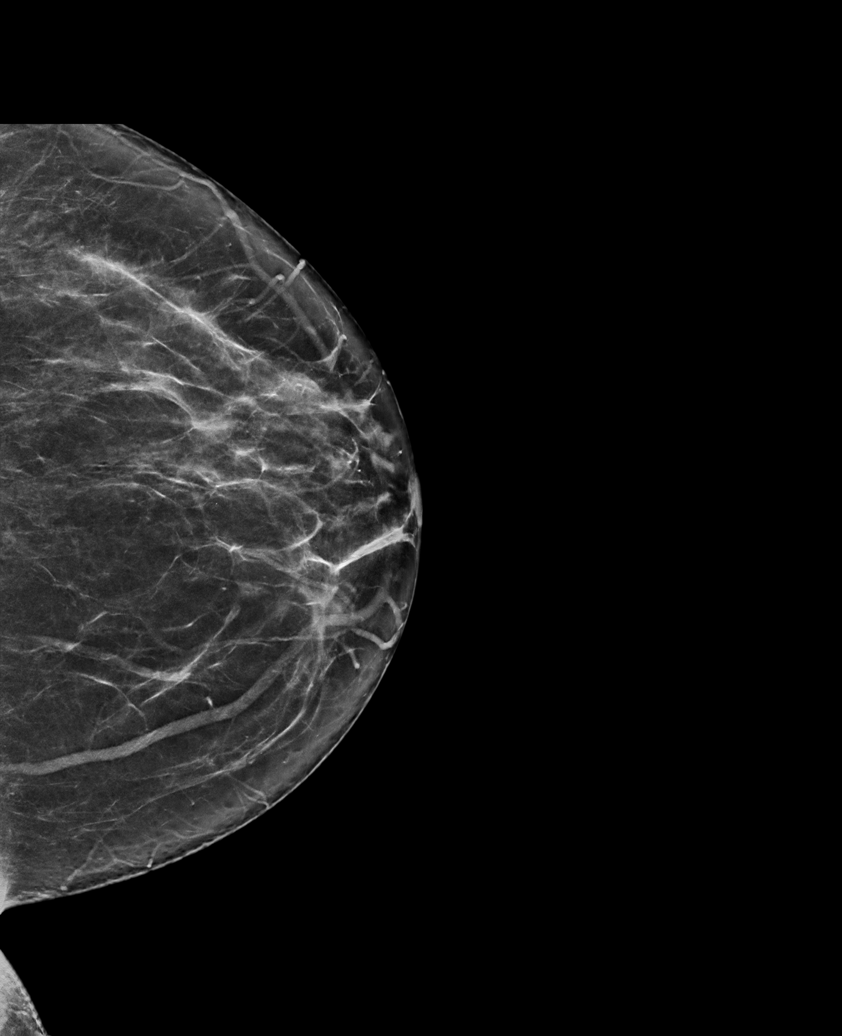

[L MLO tomo · tomo slice 48/95.0]
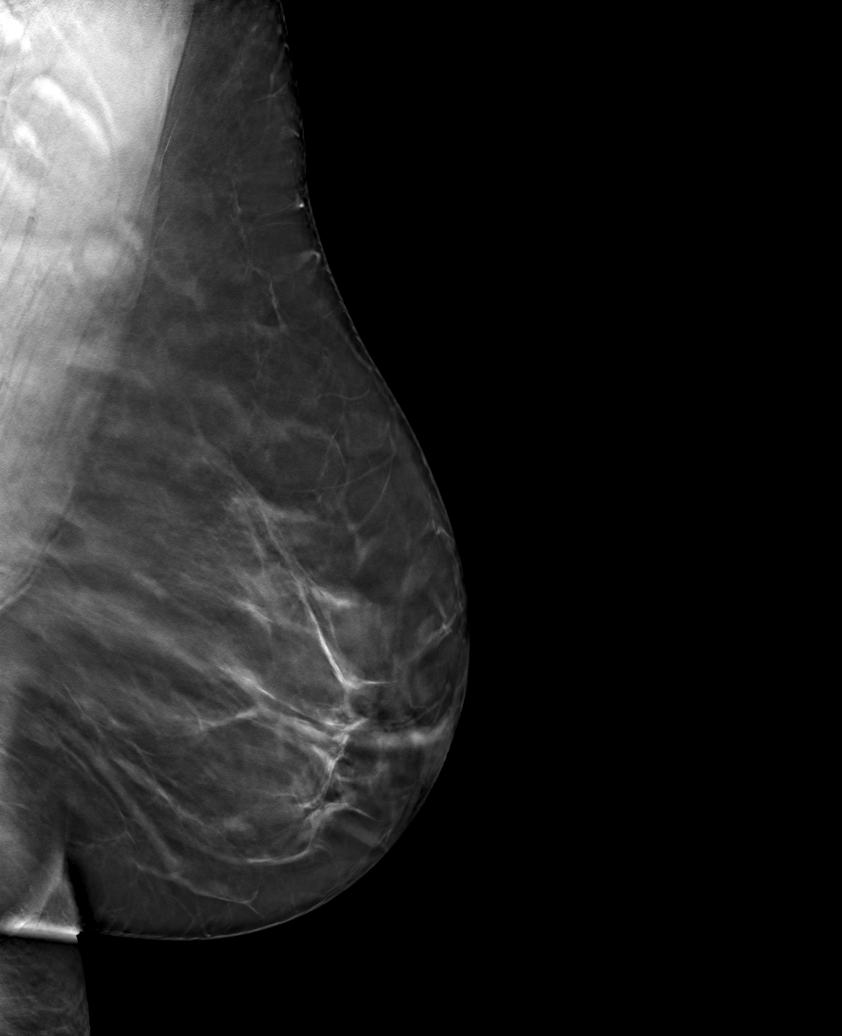

[6 of 30 positions shown; findings below may reference images not displayed]

ACR Breast Density Category c: The breast tissue is heterogeneously
dense, which may obscure small masses.
FINDINGS: There are no findings suspicious for malignancy.
IMPRESSION: No mammographic evidence of malignancy. A result letter of this
screening mammogram will be mailed directly to the patient.

RECOMMENDATION:
Screening mammogram in one year. (Code:[id number])

BI-RADS CATEGORY  1: Negative.

## 2023-05-08 ENCOUNTER — Ambulatory Visit (INDEPENDENT_AMBULATORY_CARE_PROVIDER_SITE_OTHER): Payer: BC Managed Care – PPO | Admitting: Family Medicine

## 2023-05-08 VITALS — BP 99/59 | HR 75 | Ht 61.0 in | Wt 226.0 lb

## 2023-05-08 DIAGNOSIS — Z0000 Encounter for general adult medical examination without abnormal findings: Secondary | ICD-10-CM | POA: Diagnosis not present

## 2023-05-08 DIAGNOSIS — I10 Essential (primary) hypertension: Secondary | ICD-10-CM

## 2023-05-08 NOTE — Patient Instructions (Signed)
It was wonderful to see you today.  Please bring ALL of your medications with you to every visit.   Today we talked about:  Blood pressure - Stop taking amlodipine. We will recheck your blood pressure at the nurse visit.   Work on eating three meals a day and look into getting a gym membership   We will follow up on your health goals, blood pressure in march.   Thank you for choosing [facility].   Please call [phone] with any questions about today's appointment.  Lockie Mola, MD  Family Medicine

## 2023-05-08 NOTE — Progress Notes (Unsigned)
    SUBJECTIVE:   Chief compliant/HPI: annual examination  Angelica Campbell is a 52 y.o. who presents today for an annual exam.    History tabs reviewed and updated.   Review of systems form reviewed and notable for none.   OBJECTIVE:   BP (!) 99/59   Pulse 75   Ht 5\' 1"  (1.549 m)   Wt 226 lb (102.5 kg)   SpO2 100%   BMI 42.70 kg/m   General: well appearing, in no acute distress CV: RRR, radial pulses equal and palpable, no BLE edema  Resp: Normal work of breathing on room air, CTAB Abd: Soft, non tender, non distended  Neuro: Alert & Oriented x 4    ASSESSMENT/PLAN:   Assessment & Plan Health care maintenance Referral to GI placed for colonoscopy  Patient declined vaccinations  Patient declined low dose lung cancer CT screening  Primary hypertension Patient is suprathrerapeutic with antihypertensives now with consistent use.  - Discontinue amlodipine and continue the losartan  - Follow up with BP check in one week      Annual Examination  See AVS for age appropriate recommendations  PHQ score unremarkablex                                                                                                                                       , reviewed and discussed.  BP reviewed and low. Patient has now been taking her antihypertensive more regularly now leading to some low blood pressures.   Asked about intimate partner violence and resources given as appropriate   Considered the following items based upon USPSTF recommendations: Diabetes screening: ordered Screening for elevated cholesterol: ordered HIV testing: ordered Hepatitis C: ordered Hepatitis B: discussed, not high risk  Syphilis if at high risk: discussed GC/CT not at high risk and not ordered.  Cervical cancer screening: prior Pap reviewed, repeat due in 5 years Breast cancer screening:  mom with breast cancer, mammogram this year benign, next due in one year Colorectal cancer screening:  discussed, colonoscopy ordered Lung cancer screening: discussed. See documentation below regarding indications/risks/benefits. Patient elected not to at this time.  Vaccinations - declines covid, flu, shingrix, tetanus     Lockie Mola, MD [facility]

## 2023-05-09 LAB — COMPREHENSIVE METABOLIC PANEL
ALT: 21 [IU]/L (ref 0–32)
AST: 23 [IU]/L (ref 0–40)
Albumin: 4.2 g/dL (ref 3.8–4.9)
Alkaline Phosphatase: 103 [IU]/L (ref 44–121)
BUN/Creatinine Ratio: 17 (ref 9–23)
BUN: 14 mg/dL (ref 6–24)
Bilirubin Total: 0.3 mg/dL (ref 0.0–1.2)
CO2: 21 mmol/L (ref 20–29)
Calcium: 9.5 mg/dL (ref 8.7–10.2)
Chloride: 104 mmol/L (ref 96–106)
Creatinine, Ser: 0.83 mg/dL (ref 0.57–1.00)
Globulin, Total: 3.2 g/dL (ref 1.5–4.5)
Glucose: 80 mg/dL (ref 70–99)
Potassium: 4.5 mmol/L (ref 3.5–5.2)
Sodium: 140 mmol/L (ref 134–144)
Total Protein: 7.4 g/dL (ref 6.0–8.5)
eGFR: 85 mL/min/{1.73_m2} (ref >59)

## 2023-05-09 LAB — LIPID PANEL
Chol/HDL Ratio: 5 {ratio} — ABNORMAL HIGH (ref 0.0–4.4)
Cholesterol, Total: 215 mg/dL — ABNORMAL HIGH (ref 100–199)
HDL: 43 mg/dL (ref >39)
LDL Chol Calc (NIH): 152 mg/dL — ABNORMAL HIGH (ref 0–99)
Triglycerides: 111 mg/dL (ref 0–149)
VLDL Cholesterol Cal: 20 mg/dL (ref 5–40)

## 2023-05-10 NOTE — Assessment & Plan Note (Signed)
Referral to GI placed for colonoscopy  Patient declined vaccinations  Patient declined low dose lung cancer CT screening

## 2023-05-10 NOTE — Assessment & Plan Note (Signed)
Patient is suprathrerapeutic with antihypertensives now with consistent use.  - Discontinue amlodipine and continue the losartan  - Follow up with BP check in one week

## 2023-05-19 ENCOUNTER — Encounter: Payer: Self-pay | Admitting: Family Medicine

## 2023-05-19 DIAGNOSIS — E7800 Pure hypercholesterolemia, unspecified: Secondary | ICD-10-CM

## 2023-05-21 MED ORDER — ATORVASTATIN CALCIUM 20 MG PO TABS: 20.0000 mg | ORAL_TABLET | Freq: Every day | ORAL | 3 refills | Status: DC

## 2023-05-29 ENCOUNTER — Ambulatory Visit (INDEPENDENT_AMBULATORY_CARE_PROVIDER_SITE_OTHER): Payer: BC Managed Care – PPO

## 2023-05-29 VITALS — BP 122/80 | HR 83

## 2023-05-29 DIAGNOSIS — Z0130 Encounter for examination of blood pressure without abnormal findings: Secondary | ICD-10-CM

## 2023-05-29 NOTE — Progress Notes (Signed)
 Patient here today for BP check.      Last BP was on 05/08/2023 and was 99/59.  BP today is 122/80 with a pulse of 83.    Checked BP in right arm with large adult cuff.    Symptoms present: none.   Patient last took losartan  yesterday around 1100. She discontinued amlodipine  after last visit on 05/08/23.  Patient scheduled for PCP follow up on 07/31/23.    Chiquita JAYSON English, RN

## 2023-07-07 ENCOUNTER — Encounter: Payer: Self-pay | Admitting: Pediatrics

## 2023-07-31 ENCOUNTER — Encounter: Payer: Self-pay | Admitting: Student

## 2023-07-31 ENCOUNTER — Ambulatory Visit: Payer: Self-pay | Admitting: Family Medicine

## 2023-07-31 ENCOUNTER — Ambulatory Visit: Payer: BC Managed Care – PPO | Admitting: Student

## 2023-07-31 ENCOUNTER — Telehealth: Payer: Self-pay | Admitting: Family Medicine

## 2023-07-31 VITALS — BP 140/80 | HR 81 | Ht 63.0 in | Wt 227.0 lb

## 2023-07-31 DIAGNOSIS — M1712 Unilateral primary osteoarthritis, left knee: Secondary | ICD-10-CM | POA: Diagnosis not present

## 2023-07-31 DIAGNOSIS — I10 Essential (primary) hypertension: Secondary | ICD-10-CM | POA: Diagnosis not present

## 2023-07-31 MED ORDER — OLMESARTAN MEDOXOMIL 5 MG PO TABS
10.0000 mg | ORAL_TABLET | Freq: Every day | ORAL | 0 refills | Status: DC
Start: 2023-07-31 — End: 2023-12-14

## 2023-07-31 NOTE — Progress Notes (Signed)
    SUBJECTIVE:   CHIEF COMPLAINT / HPI:   Knee OA  FMLA Patient has known tricompartmental osteoarthritis of the left knee.  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 For several years she has worked a decrease schedule on [employer].  Her work schedule is from 6 AM to 2:30 PM Monday through Friday.  She tells me that 8 hours is about as long as she is able to be on her feet due to her osteoarthritis.  Some of her coworkers works 6 AM to 5:30 PM and she has found that this work schedule is not possible due to her knee pain.  She also has accommodations where she can sit as needed at work, though most of her day is spent on her top.  There is also a contingency for her to be out for 1 to 2 days a month for doctors appointments as needed for flares/complications of her OA.  She tells me that she very seldom actually needs time away from work. She does not have plans for knee replacement surgery anytime in the near future.  She tells me that she has done physical therapy in the past with minimal improvement.  HTN Noted to have elevated pressures x 2 today.  She is on losartan 25 mg daily as monotherapy.  She tells me that she takes her medications at night and her last dose of losartan was last night.  PERTINENT  PMH / PSH: HTN, Obestiy, Anxiety   OBJECTIVE:   BP (!) 140/80   Pulse 81   Ht 5\' 3"  (1.6 m)   Wt 227 lb (103 kg)   SpO2 99%   BMI 40.21 kg/m   General: alert & oriented, no apparent distress, well groomed HEENT: normocephalic, atraumatic, EOM grossly intact, oral mucosa moist, neck supple Respiratory: normal respiratory effort GI: non-distended Skin: no rashes, no jaundice Psych: appropriate mood and affect   ASSESSMENT/PLAN:     Assessment & Plan Primary osteoarthritis of left knee -Will renew her FMLA paperwork.  She will drop off at the front. -Discussed that should she feel a arthroplasty is necessary in the future, we are happy to refer her to orthopedic  surgery. -Would suggest repeat physical therapy if symptoms change or worsen, though it seems she has been stable for quite some time Primary hypertension Above goal x 2 today.  Taking losartan at night.  Given the relative short half-life of losartan when compared to other or ARB use, do not believe this is the best option for her if she is going to take her meds at night. -Stop losartan 25 mg nightly -Start olmesartan 10 mg nightly     J Dorothyann Gibbs, MD [facility]

## 2023-07-31 NOTE — Assessment & Plan Note (Signed)
-  Will renew her FMLA paperwork.  She will drop off at the front. -Discussed that should she feel a arthroplasty is necessary in the future, we are happy to refer her to orthopedic surgery. -Would suggest repeat physical therapy if symptoms change or worsen, though it seems she has been stable for quite some time

## 2023-07-31 NOTE — Telephone Encounter (Signed)
 Patient dropped off FMLA paperwork to be completed. Last DOS was 07/31/23, patient saw Dr. Marisue Humble and stated he told her he would fill the form out for her. Placed in [employer].

## 2023-07-31 NOTE — Assessment & Plan Note (Signed)
 Above goal x 2 today.  Taking losartan at night.  Given the relative short half-life of losartan when compared to other or ARB use, do not believe this is the best option for her if she is going to take her meds at night. -Stop losartan 25 mg nightly -Start olmesartan 10 mg nightly

## 2023-07-31 NOTE — Patient Instructions (Addendum)
 Glad you have your colonoscopy scheduled! Drop your FMLA forms off at the front.  I am changing your BP med from losartan to olmesartan, These are cousin medications but the olmesartan is a bit longer-acting. This should be a better fit for you taking your meds at night.   Eliezer Mccoy, MD

## 2023-08-05 NOTE — Telephone Encounter (Signed)
 Form placed up front for pick up.   Form faxed to employer.   Copy made for batch scanning.   Patient aware.

## 2023-08-14 ENCOUNTER — Telehealth: Payer: Self-pay | Admitting: Family Medicine

## 2023-08-14 NOTE — Telephone Encounter (Signed)
 Patient walked in stating [employer] send a letter saying they returned the FMLA papers to the doctor by fax around 08/06/23.  [employer] was concerned that a portion of the forms was not filled out correctly.  Need a end date. Ph [phone]

## 2023-08-17 NOTE — Telephone Encounter (Signed)
 Patient returns call to nurse line. She states that paperwork was correct with the exception of the dates.   Dates need to indicate start date of 3/7 with an approximate 6 month end date. She is requesting recommended end date of 04/15/24.  She is going to have employer refax this paperwork.   Veronda Prude, RN

## 2023-08-18 NOTE — Telephone Encounter (Signed)
 This paperwork was actually placed in [place]'s box. I will place in [place]'s box.

## 2023-08-21 NOTE — Telephone Encounter (Signed)
 Patient stating there needs to be a fix on the second page. I have pulled the paperwork that was filled out and faxed on 08/05/2023. On second page for 8d: the end date Angelica Campbell put indefinite and they are saying the are needing a actual date. I have placed both forms in Dr. Marlou Starks box but I am sending to [place], and [facility].  Please Advise. Also please let me know when forms are completed.   Thanks!

## 2023-08-25 ENCOUNTER — Ambulatory Visit (AMBULATORY_SURGERY_CENTER): Payer: BC Managed Care – PPO

## 2023-08-25 VITALS — Ht 63.0 in | Wt 225.0 lb

## 2023-08-25 DIAGNOSIS — Z1211 Encounter for screening for malignant neoplasm of colon: Secondary | ICD-10-CM

## 2023-08-25 MED ORDER — SUFLAVE 178.7 G PO SOLR: 1.0000 | Freq: Once | ORAL | 0 refills | Status: AC

## 2023-08-25 NOTE — Progress Notes (Signed)

## 2023-09-08 ENCOUNTER — Ambulatory Visit: Admitting: Student

## 2023-09-08 VITALS — BP 135/82 | HR 87 | Temp 98.1°F | Wt 223.2 lb

## 2023-09-08 DIAGNOSIS — N939 Abnormal uterine and vaginal bleeding, unspecified: Secondary | ICD-10-CM

## 2023-09-08 DIAGNOSIS — N950 Postmenopausal bleeding: Secondary | ICD-10-CM

## 2023-09-08 NOTE — Progress Notes (Signed)
    SUBJECTIVE:   CHIEF COMPLAINT / HPI:   Angelica Campbell is a 53 y.o. female presenting for vaginal bleeding and spotting.  She reports she has not had menses in 12 months.  She thought she was done with her periods until 1 week ago she began having vaginal spotting.  She has not filled up more than a pad.  She reports she misses the most blood while she wipes.  She is scheduled for colonoscopy in 1 week.  She has no family history of endometrial cancers.  She is currently a smoker.  PERTINENT  PMH / PSH: reviewed and updated.  OBJECTIVE:   BP 135/82   Pulse 87   Temp 98.1 F (36.7 C)   Wt 223 lb 3.2 oz (101.2 kg)   LMP  (LMP Unknown)   SpO2 99%   BMI 39.54 kg/m   Well-appearing, no acute distress Cardio: Regular rate, regular rhythm, no murmurs on exam. Pulm: Clear, no wheezing, no crackles. No increased work of breathing Abdominal: bowel sounds present, soft, non-tender, non-distended Extremities: no peripheral edema  Neuro: alert and oriented x3, speech normal in content, no facial asymmetry, strength intact and equal bilaterally in UE and LE, pupils equal and reactive to light.  Psych:  Cognition and judgment appear intact. Alert, communicative  and cooperative with normal attention span and concentration. No apparent delusions, illusions, hallucinations    ASSESSMENT/PLAN:   Assessment & Plan Postmenopausal bleeding Due to age and risk factors including smoking history vaginal bleeding is concerning.  Will start workup with pelvic ultrasound and CBC to check for anemia.  Discussed the possibility of endometrial biopsy.  Patient to schedule an appointment with colposcopy clinic at [facility].  Will follow ultrasound results.     Clem Currier, DO [facility]

## 2023-09-08 NOTE — Patient Instructions (Signed)
 It was great to see you today!  I am getting blood work today.  You will be scheduled for an ultrasound.  Please schedule an appointment for an endometrial biopsy in May.   Future Appointments  Date Time Provider Department Center  09/18/2023  7:00 AM McGreal, Scarlette Currier, MD [facility]    Please arrive 15 minutes before your appointment to ensure smooth check in process.    Please call the clinic at [phone] if your symptoms worsen or you have any concerns.  Thank you for allowing me to participate in your care, Dr. Clem Currier [facility]

## 2023-09-09 ENCOUNTER — Encounter: Payer: Self-pay | Admitting: Student

## 2023-09-09 LAB — CBC
Hematocrit: 48 % — ABNORMAL HIGH (ref 34.0–46.6)
Hemoglobin: 15.3 g/dL (ref 11.1–15.9)
MCH: 27.4 pg (ref 26.6–33.0)
MCHC: 31.9 g/dL (ref 31.5–35.7)
MCV: 86 fL (ref 79–97)
Platelets: 289 10*3/uL (ref 150–450)
RBC: 5.58 x10E6/uL — ABNORMAL HIGH (ref 3.77–5.28)
RDW: 13.9 % (ref 11.7–15.4)
WBC: 5.8 10*3/uL (ref 3.4–10.8)

## 2023-09-15 ENCOUNTER — Ambulatory Visit (HOSPITAL_COMMUNITY)
Admission: RE | Admit: 2023-09-15 | Discharge: 2023-09-15 | Disposition: A | Discharge status: Home / Self Care | Source: Ambulatory Visit | Attending: Family Medicine | Admitting: Family Medicine

## 2023-09-15 DIAGNOSIS — N950 Postmenopausal bleeding: Secondary | ICD-10-CM | POA: Diagnosis present

## 2023-09-16 ENCOUNTER — Encounter: Payer: Self-pay | Admitting: Pediatrics

## 2023-09-16 NOTE — Progress Notes (Signed)
 [facility] History and Physical   Primary Care Physician:  Santa Cuba, MD   Reason for Procedure:  Colorectal cancer screening  Plan:    Screening colonoscopy     HPI: Angelica Campbell is a 53 y.o. female undergoing screening colonoscopy for colorectal cancer screening.  This is the patient's first colonoscopy.  No family history of colorectal cancer or polyps.  Patient denies current symptoms of change in bowel habits or rectal bleeding.   Past Medical History:  Diagnosis Date   Anemia    Arthritis    Hyperlipidemia    Hypertension    TOBACCO DEPENDENCE 07/23/2006   Qualifier: Diagnosis of   By: Lamona Pilon MD, Camilo Cella          No past surgical history on file.  Prior to Admission medications   Medication Sig Start Date End Date Taking? Authorizing Provider  acetaminophen (TYLENOL) 325 MG tablet Take 650 mg by mouth every 6 (six) hours as needed for mild pain.    [provider]  atorvastatin  (LIPITOR) 20 MG tablet Take 1 tablet (20 mg total) by mouth daily. 05/21/23   Santa Cuba, MD  Blood Pressure KIT 1 kit by Does not apply route daily. 09/27/21   Espinoza, Alejandra, DO  diclofenac Sodium (VOLTAREN) 1 % GEL Apply topically 4 (four) times daily.    [provider]  Elastic Bandages & Supports (KNEE BRACE) MISC 1 each by Does not apply route as needed. Patient not taking: Reported on 08/25/2023 08/11/19   Floretta Huron, MD  meloxicam  (MOBIC ) 15 MG tablet TAKE 1 TABLET BY MOUTH EVERY DAY AS NEEDED FOR PAIN Patient not taking: Reported on 08/25/2023 09/08/22   Espinoza, Alejandra, DO  olmesartan  (BENICAR ) 5 MG tablet Take 2 tablets (10 mg total) by mouth at bedtime. Patient not taking: Reported on 08/25/2023 07/31/23   Limmie Ren, MD    Current Outpatient Medications  Medication Sig Dispense Refill   acetaminophen (TYLENOL) 325 MG tablet Take 650 mg by mouth every 6 (six) hours as needed for mild pain.     atorvastatin  (LIPITOR) 20 MG  tablet Take 1 tablet (20 mg total) by mouth daily. 90 tablet 3   Blood Pressure KIT 1 kit by Does not apply route daily. 1 kit 0   diclofenac Sodium (VOLTAREN) 1 % GEL Apply topically 4 (four) times daily.     Elastic Bandages & Supports (KNEE BRACE) MISC 1 each by Does not apply route as needed. (Patient not taking: Reported on 08/25/2023) 1 each 0   meloxicam  (MOBIC ) 15 MG tablet TAKE 1 TABLET BY MOUTH EVERY DAY AS NEEDED FOR PAIN (Patient not taking: Reported on 08/25/2023) 30 tablet 0   olmesartan  (BENICAR ) 5 MG tablet Take 2 tablets (10 mg total) by mouth at bedtime. (Patient not taking: Reported on 08/25/2023) 180 tablet 0   No current facility-administered medications for this visit.    Allergies as of 09/18/2023   (No Known Allergies)    Family History  Problem Relation Age of Onset   Breast cancer Mother 21   Hypertension Mother    Diabetes Brother    Colon cancer Neg Hx    Colon polyps Neg Hx    Esophageal cancer Neg Hx    Rectal cancer Neg Hx    Stomach cancer Neg Hx     Social History   Socioeconomic History   Marital status: Single    Spouse name: Not on file   Number of children: Not on file  Years of education: Not on file   Highest education level: Not on file  Occupational History   Not on file  Tobacco Use   Smoking status: Every Day    Current packs/day: 1.00    Types: Cigarettes   Smokeless tobacco: Never  Vaping Use   Vaping status: Never Used  Substance and Sexual Activity   Alcohol use: No   Drug use: No   Sexual activity: Yes  Other Topics Concern   Not on file  Social History Narrative   Not on file   Social Drivers of Health   Financial Resource Strain: Not on file  Food Insecurity: Not on file  Transportation Needs: Not on file  Physical Activity: Not on file  Stress: Not on file  Social Connections: Not on file  Intimate Partner Violence: Not on file    Review of Systems:  All other review of systems negative except as mentioned  in the HPI.  Physical Exam: Vital signs LMP  (LMP Unknown)   General:   Alert,  Well-developed, well-nourished, pleasant and cooperative in NAD Airway:  Mallampati 2 Lungs:  Clear throughout to auscultation.   Heart:  Regular rate and rhythm; no murmurs, clicks, rubs,  or gallops. Abdomen:  Soft, nontender and nondistended. Normal bowel sounds.   Neuro/Psych:  Normal mood and affect. A and O x 3  Eugenia Hess, MD [facility]

## 2023-09-18 ENCOUNTER — Encounter: Payer: Self-pay | Admitting: Pediatrics

## 2023-09-18 ENCOUNTER — Ambulatory Visit: Payer: BC Managed Care – PPO | Admitting: Pediatrics

## 2023-09-18 VITALS — BP 135/86 | HR 74 | Temp 97.9°F | Resp 16 | Ht 63.0 in | Wt 225.0 lb

## 2023-09-18 DIAGNOSIS — D124 Benign neoplasm of descending colon: Secondary | ICD-10-CM

## 2023-09-18 DIAGNOSIS — K621 Rectal polyp: Secondary | ICD-10-CM | POA: Diagnosis not present

## 2023-09-18 DIAGNOSIS — K5730 Diverticulosis of large intestine without perforation or abscess without bleeding: Secondary | ICD-10-CM

## 2023-09-18 DIAGNOSIS — D128 Benign neoplasm of rectum: Secondary | ICD-10-CM

## 2023-09-18 DIAGNOSIS — D125 Benign neoplasm of sigmoid colon: Secondary | ICD-10-CM

## 2023-09-18 DIAGNOSIS — Z1211 Encounter for screening for malignant neoplasm of colon: Secondary | ICD-10-CM

## 2023-09-18 DIAGNOSIS — K635 Polyp of colon: Secondary | ICD-10-CM

## 2023-09-18 MED ORDER — SODIUM CHLORIDE 0.9 % IV SOLN: 500.0000 mL | INTRAVENOUS | Status: DC

## 2023-09-18 NOTE — Progress Notes (Signed)
 Sedate, gd SR, tolerated procedure well, VSS, report to RN

## 2023-09-18 NOTE — Progress Notes (Signed)
 Pt's states no medical or surgical changes since previsit or office visit.

## 2023-09-18 NOTE — Op Note (Signed)
 [facility] Patient Name: Angelica Campbell Procedure Date: 09/18/2023 7:58 AM MRN: [id number] Endoscopist: Eugenia Hess , MD, [id number] Age: 53 Referring MD:  Date of Birth: 1971/05/23 Gender: Female Account #: [id number] Procedure:                Colonoscopy Indications:              Screening for colorectal malignant neoplasm, This                            is the patient's first colonoscopy Medicines:                Monitored Anesthesia Care Procedure:                Pre-Anesthesia Assessment:                           - Prior to the procedure, a History and Physical                            was performed, and patient medications and                            allergies were reviewed. The patient's tolerance of                            previous anesthesia was also reviewed. The risks                            and benefits of the procedure and the sedation                            options and risks were discussed with the patient.                            All questions were answered, and informed consent                            was obtained. Prior Anticoagulants: The patient has                            taken no anticoagulant or antiplatelet agents. ASA                            Grade Assessment: II - A patient with mild systemic                            disease. After reviewing the risks and benefits,                            the patient was deemed in satisfactory condition to                            undergo the procedure.  After obtaining informed consent, the colonoscope                            was passed under direct vision. Throughout the                            procedure, the patient's blood pressure, pulse, and                            oxygen saturations were monitored continuously. The                            CF [id number] #[id number] was introduced through the anus                            and advanced to the  cecum, identified by                            appendiceal orifice and ileocecal valve. The                            colonoscopy was performed without difficulty. The                            patient tolerated the procedure well. The quality                            of the bowel preparation was good. The ileocecal                            valve, appendiceal orifice, and rectum were                            photographed. Scope In: 8:02:24 AM Scope Out: 8:27:01 AM Scope Withdrawal Time: 0 hours 19 minutes 51 seconds  Total Procedure Duration: 0 hours 24 minutes 37 seconds  Findings:                 The perianal and digital rectal examinations were                            normal. Pertinent negatives include normal                            sphincter tone and no palpable rectal lesions.                           Multiple small-mouthed diverticula were found in                            the sigmoid colon and descending colon.                           A 4 mm polyp was found in the descending colon. The  polyp was sessile. The polyp was removed with a                            cold biopsy forceps. Resection and retrieval were                            complete.                           Four sessile polyps were found in the sigmoid                            colon. The polyps were 2 to 4 mm in size. These                            polyps were removed with a cold snare. Resection                            was complete, but the polyp tissue was only                            partially retrieved -2 polyps retrieved.                           A 13 mm polyp was found in the sigmoid colon. The                            polyp was pedunculated. The polyp was removed with                            a hot snare. Resection and retrieval were complete.                           Multiple sessile polyps were found in the rectum.                             These had the appearance of hyperplastic polyps.                            The polyps were diminutive in size. Biopsies were                            taken with a cold forceps for histology.                           The retroflexed view of the distal rectum and anal                            verge was normal and showed no anal or rectal                            abnormalities. Complications:  No immediate complications. Estimated blood loss:                            Minimal. Estimated Blood Loss:     Estimated blood loss was minimal. Impression:               - Diverticulosis in the sigmoid colon and in the                            descending colon.                           - One 4 mm polyp in the descending colon, removed                            with a cold biopsy forceps. Resected and retrieved.                           - Four 2 to 4 mm polyps in the sigmoid colon,                            removed with a cold snare. Complete resection.                            Partial retrieval -2 polyps retrieved.                           - One 13 mm polyp in the sigmoid colon, removed                            with a hot snare. Resected and retrieved.                           - Multiple diminutive polyps in the rectum. These                            had the appearance of hyperplastic polyps. Biopsied.                           - The distal rectum and anal verge are normal on                            retroflexion view. Recommendation:           - Discharge patient to home.                           - Await pathology results.                           - Repeat colonoscopy for surveillance based on                            pathology results.                           -  The findings and recommendations were discussed                            with the patient's family.                           - Return to referring physician.                           - Patient has  a contact number available for                            emergencies. The signs and symptoms of potential                            delayed complications were discussed with the                            patient. Return to normal activities tomorrow.                            Written discharge instructions were provided to the                            patient. Eugenia Hess, MD 09/18/2023 8:33:29 AM This report has been signed electronically.

## 2023-09-18 NOTE — Patient Instructions (Signed)
 Educational handout provided to patient related to Polyps, and Diverticulosis  Resume previous diet  Continue present medications  Awaiting pathology results   YOU HAD AN ENDOSCOPIC PROCEDURE TODAY AT THE [facility]:   Refer to the procedure report that was given to you for any specific questions about what was found during the examination.  If the procedure report does not answer your questions, please call your gastroenterologist to clarify.  If you requested that your care partner not be given the details of your procedure findings, then the procedure report has been included in a sealed envelope for you to review at your convenience later.  YOU SHOULD EXPECT: Some feelings of bloating in the abdomen. Passage of more gas than usual.  Walking can help get rid of the air that was put into your GI tract during the procedure and reduce the bloating. If you had a lower endoscopy (such as a colonoscopy or flexible sigmoidoscopy) you may notice spotting of blood in your stool or on the toilet paper. If you underwent a bowel prep for your procedure, you may not have a normal bowel movement for a few days.  Please Note:  You might notice some irritation and congestion in your nose or some drainage.  This is from the oxygen used during your procedure.  There is no need for concern and it should clear up in a day or so.  SYMPTOMS TO REPORT IMMEDIATELY:  Following lower endoscopy (colonoscopy or flexible sigmoidoscopy):  Excessive amounts of blood in the stool  Significant tenderness or worsening of abdominal pains  Swelling of the abdomen that is new, acute  Fever of 100F or higher  For urgent or emergent issues, a gastroenterologist can be reached at any hour by calling [phone]. Do not use MyChart messaging for urgent concerns.    DIET:  We do recommend a small meal at first, but then you may proceed to your regular diet.  Drink plenty of fluids but you should avoid  alcoholic beverages for 24 hours.  ACTIVITY:  You should plan to take it easy for the rest of today and you should NOT DRIVE or use heavy machinery until tomorrow (because of the sedation medicines used during the test).    FOLLOW UP: Our staff will call the number listed on your records the next business day following your procedure.  We will call around 7:15- 8:00 am to check on you and address any questions or concerns that you may have regarding the information given to you following your procedure. If we do not reach you, we will leave a message.     If any biopsies were taken you will be contacted by phone or by letter within the next 1-3 weeks.  Please call us at [phone] if you have not heard about the biopsies in 3 weeks.    SIGNATURES/CONFIDENTIALITY: You and/or your care partner have signed paperwork which will be entered into your electronic medical record.  These signatures attest to the fact that that the information above on your After Visit Summary has been reviewed and is understood.  Full responsibility of the confidentiality of this discharge information lies with you and/or your care-partner.

## 2023-09-21 ENCOUNTER — Telehealth: Payer: Self-pay

## 2023-09-21 NOTE — Telephone Encounter (Signed)
 No answer, left message to call if having any issues or concerns, B.Vale Mousseau RN

## 2023-09-22 ENCOUNTER — Encounter: Payer: Self-pay | Admitting: Student

## 2023-09-23 ENCOUNTER — Encounter: Payer: Self-pay | Admitting: Pediatrics

## 2023-09-23 LAB — SURGICAL PATHOLOGY

## 2023-09-24 ENCOUNTER — Telehealth: Payer: Self-pay | Admitting: Pediatrics

## 2023-09-24 NOTE — Telephone Encounter (Signed)
 PT would like to have the nurse either contact or send through mychart fiber enriched diets. Please advise.

## 2023-09-28 NOTE — Telephone Encounter (Signed)
Patient returning phone call. Requesting a call back. Please advise, thank you.

## 2023-09-28 NOTE — Telephone Encounter (Signed)
 Lm stating that I will mail her a pamphlet on high fiber diets.

## 2023-09-28 NOTE — Telephone Encounter (Signed)
 I spoke to [facility] and I advised her that I sent a pamphlet by mail to her.  She said that she wanted to increase the fiber in her diet because she was diagnosed with diverticulitis.  I told her that there are several things she can include in her diet like apples, pears, strawberries, raspberries, avocados, broccoli, carrots, brussels sprouts, artichokes, spinach, lentils, beans, chickpeas, oatmeal, brown rice, whole-wheat breads, chia seeds, flax seeds, and almonds.  I also told her that she can try adding a fiber supplement like Metamucil, Benefiber, or Citrucel.  I advised her to call us  back if she has any additional questions.

## 2023-10-22 ENCOUNTER — Ambulatory Visit

## 2023-12-14 ENCOUNTER — Other Ambulatory Visit: Payer: Self-pay

## 2023-12-14 DIAGNOSIS — I10 Essential (primary) hypertension: Secondary | ICD-10-CM

## 2023-12-14 MED ORDER — OLMESARTAN MEDOXOMIL 5 MG PO TABS
10.0000 mg | ORAL_TABLET | Freq: Every day | ORAL | 1 refills | Status: DC
Start: 2023-12-14 — End: 2024-02-12

## 2024-01-15 ENCOUNTER — Other Ambulatory Visit: Payer: Self-pay | Admitting: Family Medicine

## 2024-01-15 DIAGNOSIS — Z1231 Encounter for screening mammogram for malignant neoplasm of breast: Secondary | ICD-10-CM

## 2024-02-12 ENCOUNTER — Other Ambulatory Visit (HOSPITAL_COMMUNITY): Payer: Self-pay

## 2024-02-12 ENCOUNTER — Encounter: Payer: Self-pay | Admitting: Family Medicine

## 2024-02-12 ENCOUNTER — Ambulatory Visit: Admitting: Family Medicine

## 2024-02-12 ENCOUNTER — Telehealth: Payer: Self-pay

## 2024-02-12 VITALS — BP 152/93 | HR 79 | Ht 62.0 in | Wt 224.2 lb

## 2024-02-12 DIAGNOSIS — N939 Abnormal uterine and vaginal bleeding, unspecified: Secondary | ICD-10-CM | POA: Diagnosis not present

## 2024-02-12 DIAGNOSIS — M1712 Unilateral primary osteoarthritis, left knee: Secondary | ICD-10-CM | POA: Diagnosis not present

## 2024-02-12 DIAGNOSIS — I10 Essential (primary) hypertension: Secondary | ICD-10-CM | POA: Diagnosis not present

## 2024-02-12 MED ORDER — DICLOFENAC SODIUM 1 % EX GEL: 2.0000 g | Freq: Four times a day (QID) | CUTANEOUS | 1 refills | Status: AC | PRN

## 2024-02-12 MED ORDER — OLMESARTAN MEDOXOMIL 20 MG PO TABS: 20.0000 mg | ORAL_TABLET | Freq: Every day | ORAL | 0 refills | Status: DC

## 2024-02-12 NOTE — Assessment & Plan Note (Signed)
 Uncontrolled at this time.  Most likely needs higher dose of medication.  Patient is adherent with her medication. - Counseled on lifestyle improvements that could be made - Increased olmesartan  from 10 mg to 20 mg follow-up in 2 weeks for BP recheck and BMP

## 2024-02-12 NOTE — Progress Notes (Signed)
    SUBJECTIVE:   CHIEF COMPLAINT / HPI:   Arthritis I FMLA  Patient would like to renew her FMLA paperwork for reduced work hours (40-hour work week) and accommodations to be able to sit and have chair readily accessible for tricompartment arthritis. Patient says she is using arthritis Tylenol for her pain as needed.  She said she had been using muscle relaxants at one point and was wondering about these.  Otherwise says that she had done PT in the past and continues to try to stay active as much as possible.  She would like to put off surgery for as long as possible.  She is able to do her job with the accommodations.  She says that she has gone steroid injections once is not sure if they have helped much.  Is using a topical gel at home but is unsure what it is called.  Vaginal bleeding Patient says that her intermenstrual vaginal bleeding has resolved.  She says that she might have just been having irregular periods.  She has not gone 1 year without a period.  Says that she has not had any intermenstrual bleeding since then has just had fewer periods.  Says that after she was told her pelvic ultrasound was normal she wanted to hold off on any endometrial biopsy given she thinks her bleeding was irregular periods.  Patient says she had a period in May and August most recently.  Hypertension Patient was switched from losartan  25 mg to olmesartan  10 mg nightly couple months ago due to insufficient control.  Patient has not had any adverse side effects.  She says that she  takes this every single night.  PERTINENT  PMH / PSH: Hypertension, arthritis  OBJECTIVE:   BP (!) 152/93   Pulse 79   Ht 5' 2 (1.575 m)   Wt 224 lb 3.2 oz (101.7 kg)   LMP  (LMP Unknown)   SpO2 100%   BMI 41.01 kg/m   General: Well-appearing, In no  acute distress CV: Well-perfused, cap refill less than 2 seconds Resp: Normal work of breathing on room air MSK: Bilateral knees mildly edematous no erythema with  positive grind, antalgic gait  ASSESSMENT/PLAN:   Assessment & Plan Primary osteoarthritis of left knee Patient is able to function at work with reduced hours and accommodations. Counseled patient that she can use Tylenol and ibuprofen  as needed but to avoid daily ibuprofen . Offered patient knee injections to help her continue to function pain.  Patient will consider and schedule if she would like Refilled Voltaren  gel Vaginal bleeding Resolved.  Previously thought that patient had postmenopausal bleeding however patient was not postmenopausal at that point.  She continues to have irregular periods.  Patient is now keeping track of her periods.  Previous CBC and pelvic ultrasound were benign or normal.  Can consider endometrial biopsy if concerned in the future. - Continue to monitor Primary hypertension Uncontrolled at this time.  Most likely needs higher dose of medication.  Patient is adherent with her medication. - Counseled on lifestyle improvements that could be made - Increased olmesartan  from 10 mg to 20 mg follow-up in 2 weeks for BP recheck and BMP     Areta Saliva, MD [facility]

## 2024-02-12 NOTE — Telephone Encounter (Signed)
 Awaiting chart notes to close   Key: [facility]

## 2024-02-12 NOTE — Assessment & Plan Note (Signed)
 Patient is able to function at work with reduced hours and accommodations. Counseled patient that she can use Tylenol and ibuprofen  as needed but to avoid daily ibuprofen . Offered patient knee injections to help her continue to function pain.  Patient will consider and schedule if she would like Refilled Voltaren  gel

## 2024-02-12 NOTE — Patient Instructions (Signed)
 It was wonderful to see you today.  Please bring ALL of your medications with you to every visit.   Today we talked about:  FMLA arthritis - I have filled out your forms per request. I recommend you also take ibuprofen  600-800 mg as needed. Track how often you are taking it . I would not like if you took it every day as it can irritate your stomach. I also refilled your voltaren  gel. You should consider restarting injections as this can help with your pain.   For your Hypertension it is uncontrolled at this time. I recommend a higher dose of olmesartan . I sent in 20 mg. You can start taking this instead of your 10 mg prescription.   Follow up in 2 weeks for BP check and labs.    Thank you for choosing [facility].   Please call [phone] with any questions about today's appointment.  Please be sure to schedule follow up at the front desk before you leave today.   Areta Saliva, MD  Family Medicine

## 2024-02-15 NOTE — Telephone Encounter (Signed)
 Pharmacy Patient Advocate Encounter  Received notification from [facility] that Prior Authorization for DICLOFENAC  SODIUM 1% GEL has been APPROVED from 02/15/24 to 02/14/25   PA #/Case ID/Reference #: [id number]

## 2024-02-15 NOTE — Telephone Encounter (Signed)
 Prior authorization submitted for DICLOFENAC  SODIUM 1% GEL to [facility] via Lat[employer].   Key: [id number]

## 2024-02-25 ENCOUNTER — Ambulatory Visit: Admitting: Family Medicine

## 2024-03-04 ENCOUNTER — Ambulatory Visit
Admission: RE | Admit: 2024-03-04 | Discharge: 2024-03-04 | Disposition: A | Discharge status: Home / Self Care | Source: Ambulatory Visit | Attending: Family Medicine | Admitting: Family Medicine

## 2024-03-04 ENCOUNTER — Encounter: Payer: Self-pay | Admitting: Family Medicine

## 2024-03-04 ENCOUNTER — Ambulatory Visit: Admitting: Family Medicine

## 2024-03-04 VITALS — BP 124/82 | HR 86 | Ht 62.0 in | Wt 224.4 lb

## 2024-03-04 DIAGNOSIS — I10 Essential (primary) hypertension: Secondary | ICD-10-CM | POA: Diagnosis not present

## 2024-03-04 DIAGNOSIS — Z1231 Encounter for screening mammogram for malignant neoplasm of breast: Secondary | ICD-10-CM

## 2024-03-04 NOTE — Patient Instructions (Addendum)
 Your blood pressure looks great today! I am glad you are not having any side effects. Continue the medication. I ordered a future lab order. You just have to call to make a lab appointment to get it done.

## 2024-03-04 NOTE — Progress Notes (Signed)
    SUBJECTIVE:   CHIEF COMPLAINT / HPI:   Hypertension Patient has been taking her higher dose of olmesartan  20 mg daily without any side effects.  PERTINENT  PMH / PSH: HTN  OBJECTIVE:   BP 124/82   Pulse 86   Ht 5' 2 (1.575 m)   Wt 224 lb 6.4 oz (101.8 kg)   LMP  (LMP Unknown)   SpO2 99%   BMI 41.04 kg/m   General: well appearing, in no acute distress CV: RRR, radial pulses equal and palpable, no BLE edema  Resp: Normal work of breathing on room air, CTAB    ASSESSMENT/PLAN:   Assessment & Plan Primary hypertension Well-controlled with higher dose of olmesartan . - Continue olmesartan  20 mg - BMP today    Areta Saliva, MD [facility]

## 2024-03-05 ENCOUNTER — Other Ambulatory Visit: Payer: Self-pay | Admitting: Family Medicine

## 2024-03-05 DIAGNOSIS — I10 Essential (primary) hypertension: Secondary | ICD-10-CM

## 2024-03-05 NOTE — Assessment & Plan Note (Signed)
 Well-controlled with higher dose of olmesartan . - Continue olmesartan  20 mg - BMP today

## 2024-06-04 ENCOUNTER — Other Ambulatory Visit: Payer: Self-pay | Admitting: Family Medicine

## 2024-06-04 DIAGNOSIS — E7800 Pure hypercholesterolemia, unspecified: Secondary | ICD-10-CM
